# Patient Record
Sex: Male | Born: 1990 | Race: White | Hispanic: No | Marital: Married | Smoking: Former smoker
Health system: Southern US, Community
[De-identification: ages and names within clinical notes are randomized; demographics above are authoritative.]

## PROBLEM LIST (undated history)

## (undated) DIAGNOSIS — K219 Gastro-esophageal reflux disease without esophagitis: Secondary | ICD-10-CM

## (undated) HISTORY — PX: NO PAST SURGERIES: SHX2092

---
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
## 2013-05-01 ENCOUNTER — Ambulatory Visit: Payer: Self-pay | Admitting: Physician Assistant

## 2014-04-22 ENCOUNTER — Ambulatory Visit: Payer: Self-pay

## 2016-09-30 ENCOUNTER — Encounter (HOSPITAL_COMMUNITY): Payer: Self-pay | Admitting: Emergency Medicine

## 2016-09-30 ENCOUNTER — Emergency Department (HOSPITAL_COMMUNITY)
Admission: EM | Admit: 2016-09-30 | Discharge: 2016-10-01 | Disposition: A | Discharge status: Home / Self Care | Payer: No Typology Code available for payment source | Attending: Dermatology | Admitting: Dermatology

## 2016-09-30 ENCOUNTER — Emergency Department (HOSPITAL_COMMUNITY): Payer: No Typology Code available for payment source

## 2016-09-30 DIAGNOSIS — S81812A Laceration without foreign body, left lower leg, initial encounter: Secondary | ICD-10-CM | POA: Diagnosis present

## 2016-09-30 DIAGNOSIS — Y929 Unspecified place or not applicable: Secondary | ICD-10-CM | POA: Insufficient documentation

## 2016-09-30 DIAGNOSIS — S61419A Laceration without foreign body of unspecified hand, initial encounter: Secondary | ICD-10-CM

## 2016-09-30 DIAGNOSIS — Z23 Encounter for immunization: Secondary | ICD-10-CM | POA: Insufficient documentation

## 2016-09-30 DIAGNOSIS — Y999 Unspecified external cause status: Secondary | ICD-10-CM | POA: Insufficient documentation

## 2016-09-30 DIAGNOSIS — Y939 Activity, unspecified: Secondary | ICD-10-CM | POA: Insufficient documentation

## 2016-09-30 DIAGNOSIS — W3189XA Contact with other specified machinery, initial encounter: Secondary | ICD-10-CM | POA: Diagnosis not present

## 2016-09-30 DIAGNOSIS — S61412A Laceration without foreign body of left hand, initial encounter: Secondary | ICD-10-CM | POA: Diagnosis not present

## 2016-09-30 MED ORDER — LIDOCAINE HCL (PF) 1 % IJ SOLN
20.0000 mL | Freq: Once | INTRAMUSCULAR | Status: AC
  Administered 2016-09-30: 20 mL via INTRADERMAL
  Filled 2016-09-30: qty 20

## 2016-09-30 MED ORDER — TETANUS-DIPHTH-ACELL PERTUSSIS 5-2.5-18.5 LF-MCG/0.5 IM SUSP
0.5000 mL | Freq: Once | INTRAMUSCULAR | Status: AC
  Administered 2016-09-30: 0.5 mL via INTRAMUSCULAR
  Filled 2016-09-30: qty 0.5

## 2016-09-30 MED ORDER — DIPHENHYDRAMINE HCL 50 MG/ML IJ SOLN
25.0000 mg | Freq: Once | INTRAMUSCULAR | Status: AC
  Administered 2016-10-01: 25 mg via INTRAVENOUS
  Filled 2016-09-30: qty 1

## 2016-09-30 MED ORDER — FENTANYL CITRATE (PF) 100 MCG/2ML IJ SOLN: 50.0000 ug | INTRAMUSCULAR | Status: DC | PRN

## 2016-09-30 MED ORDER — LIDOCAINE HCL (PF) 1 % IJ SOLN
10.0000 mL | Freq: Once | INTRAMUSCULAR | Status: AC
  Administered 2016-09-30: 10 mL via INTRADERMAL
  Filled 2016-09-30: qty 10

## 2016-09-30 MED ORDER — CEFAZOLIN SODIUM-DEXTROSE 1-4 GM/50ML-% IV SOLN
1.0000 g | Freq: Once | INTRAVENOUS | Status: AC
  Administered 2016-10-01: 1 g via INTRAVENOUS
  Filled 2016-09-30: qty 50

## 2016-09-30 MED ORDER — ONDANSETRON HCL 4 MG/2ML IJ SOLN
4.0000 mg | Freq: Once | INTRAMUSCULAR | Status: AC
  Administered 2016-09-30: 4 mg via INTRAVENOUS
  Filled 2016-09-30: qty 2

## 2016-09-30 NOTE — ED Provider Notes (Signed)
[facility] Provider Note   CSN: [id number] Arrival date & time: 09/30/16  1830     History   Chief Complaint Chief Complaint  Patient presents with  . Extremity Laceration    HPI Shawn Calhoun is a 26 y.o. male.  Patient reports that he was using a power tool. Slipped. Sustained lacerations to his left thumb as well as his left lower leg. Denies falling to the ground. Did not hit his head. Did not lose consciousness.   The history is provided by the patient and the EMS personnel.  Illness  This is a new problem. The current episode started less than 1 hour ago. The problem occurs constantly. The problem has not changed since onset.Pertinent negatives include no chest pain, no abdominal pain, no headaches and no shortness of breath. He has tried nothing for the symptoms.    History reviewed. No pertinent past medical history.  There are no active problems to display for this patient.   History reviewed. No pertinent surgical history.     Home Medications    Prior to Admission medications   Medication Sig Start Date End Date Taking? Authorizing Provider  cephALEXin (KEFLEX) 500 MG capsule Take 1 capsule (500 mg total) by mouth 3 (three) times daily. 10/01/16 10/08/16  Lindalou Hose, MD    Family History No family history on file.  Social History Social History  Substance Use Topics  . Smoking status: Never Smoker  . Smokeless tobacco: Never Used  . Alcohol use Yes     Comment: socially     Allergies   Augmentin [amoxicillin-pot clavulanate]; Penicillins; and Sulfa antibiotics   Review of Systems Review of Systems  Respiratory: Negative for shortness of breath.   Cardiovascular: Negative for chest pain.  Gastrointestinal: Negative for abdominal pain, nausea and vomiting.  Musculoskeletal: Negative for back pain and neck pain.  Skin: Positive for wound.  Neurological: Negative for headaches.       No LOC  All other systems reviewed and are  negative.    Physical Exam Updated Vital Signs BP 119/82   Pulse 80   Temp 98.3 F (36.8 C) (Oral)   Resp (!) 21   Ht 5\' 7"  (1.702 m)   Wt 74.8 kg (165 lb)   SpO2 100%   BMI 25.84 kg/m   Physical Exam  Constitutional: He appears well-developed and well-nourished.  HENT:  Head: Normocephalic and atraumatic.  Eyes: Conjunctivae are normal.  Neck: Neck supple.  Cardiovascular: Normal rate and regular rhythm.   No murmur heard. Pulmonary/Chest: Effort normal and breath sounds normal. No respiratory distress.  Abdominal: Soft. There is no tenderness.  Musculoskeletal: He exhibits no edema.  Large complex laceration to the left thumb. Wound with macerated edges. Hemostatic. All tendons are isolated and functional. Sensation intact in the distal thumb. Good cap refill. Large laceration to the left anterior lower leg. Hemostatic. Neurovascularly intact. Full range of motion.  Neurological: He is alert.  Skin: Skin is warm and dry.  Psychiatric: He has a normal mood and affect.  Nursing note and vitals reviewed.    ED Treatments / Results  Labs (all labs ordered are listed, but only abnormal results are displayed) Labs Reviewed - No data to display  EKG  EKG Interpretation None       Radiology Dg Tibia/fibula Left  Result Date: 09/30/2016 CLINICAL DATA:  26 year old male with trauma and laceration of the left leg. EXAM: LEFT TIBIA AND FIBULA - 2 VIEW COMPARISON:  None.  FINDINGS: There is no acute fracture or dislocation. The bones are well mineralized. No arthritic changes. There is laceration of the soft tissues of the proximal anteromedial leg. Tiny radiodense foci along the inferior margin of the laceration likely represent gravel. No definite radiopaque foreign object. IMPRESSION: 1. No acute osseous pathology. 2. Laceration of the skin and soft tissues of the medial aspect of the proximal leg. No radiopaque foreign object. Electronically Signed   By: Elgie CollardArash  Radparvar  M.D.   On: 09/30/2016 19:58   Dg Hand Complete Left  Result Date: 09/30/2016 CLINICAL DATA:  Left hand laceration.  Initial encounter. EXAM: LEFT HAND - COMPLETE 3+ VIEW COMPARISON:  None. FINDINGS: There is evidence of soft tissue injury to the thumb at the level the proximal phalanx. No radiopaque foreign body, fracture, or dislocation is identified. Bone mineralization appears normal. IMPRESSION: Thumb soft tissue injury without acute osseous abnormality. Electronically Signed   By: Sebastian AcheAllen  Grady M.D.   On: 09/30/2016 19:57    Procedures .Marland Kitchen.Laceration Repair Date/Time: 10/01/2016 12:27 AM Performed by: Lindalou Hose'ROURKE, Giah Fickett Authorized by: Cathren LaineSTEINL, KEVIN   Consent:    Consent obtained:  Verbal   Consent given by:  Patient Anesthesia (see MAR for exact dosages):    Anesthesia method:  Local infiltration   Local anesthetic:  Lidocaine 1% w/o epi Laceration details:    Location:  Hand   Hand location: L thumb.   Length (cm):  10   Depth (mm):  3 Repair type:    Repair type:  Complex Pre-procedure details:    Preparation:  Patient was prepped and draped in usual sterile fashion Exploration:    Wound exploration: wound explored through full range of motion and entire depth of wound probed and visualized   Treatment:    Area cleansed with:  Soap and water   Amount of cleaning:  Extensive   Irrigation solution:  Sterile saline   Irrigation method:  Syringe   Debridement:  Minimal Skin repair:    Repair method:  Sutures   Suture size:  5-0   Suture material:  Prolene   Number of sutures:  12 Approximation:    Approximation:  Loose   Vermilion border: poorly aligned   Post-procedure details:    Dressing:  Antibiotic ointment, splint for protection and sterile dressing   Patient tolerance of procedure:  Tolerated well, no immediate complications .Marland Kitchen.Laceration Repair Date/Time: 10/01/2016 12:29 AM Performed by: Lindalou Hose'ROURKE, Celeste Tavenner Authorized by: Cathren LaineSTEINL, KEVIN   Consent:    Consent obtained:   Verbal   Consent given by:  Patient Anesthesia (see MAR for exact dosages):    Anesthesia method:  Local infiltration   Local anesthetic:  Lidocaine 1% w/o epi Laceration details:    Location:  Leg   Leg location:  L lower leg   Length (cm):  10   Depth (mm):  20 Repair type:    Repair type:  Intermediate Exploration:    Wound exploration: wound explored through full range of motion and entire depth of wound probed and visualized   Treatment:    Amount of cleaning:  Extensive   Irrigation solution:  Sterile saline   Irrigation method:  Syringe Subcutaneous repair:    Suture size:  3-0   Suture material:  Chromic gut   Suture technique:  Simple interrupted   Number of sutures:  4 Skin repair:    Repair method:  Sutures   Suture size:  3-0   Suture material:  Prolene   Suture technique:  Simple interrupted   Number of sutures:  16 Approximation:    Approximation:  Close   Vermilion border: well-aligned   Post-procedure details:    Dressing:  Antibiotic ointment and sterile dressing   Patient tolerance of procedure:  Tolerated well, no immediate complications   (including critical care time)  Medications Ordered in ED Medications  fentaNYL (SUBLIMAZE) injection 50 mcg (not administered)  ceFAZolin (ANCEF) IVPB 1 g/50 mL premix (1 g Intravenous New Bag/Given 10/01/16 0015)  Tdap (BOOSTRIX) injection 0.5 mL (0.5 mLs Intramuscular Given 09/30/16 1921)  lidocaine (PF) (XYLOCAINE) 1 % injection 10 mL (10 mLs Intradermal Given by Other 09/30/16 1922)  lidocaine (PF) (XYLOCAINE) 1 % injection 20 mL (20 mLs Intradermal Given by Other 09/30/16 2304)  ondansetron (ZOFRAN) injection 4 mg (4 mg Intravenous Given 09/30/16 2304)  diphenhydrAMINE (BENADRYL) injection 25 mg (25 mg Intravenous Given 10/01/16 0015)     Initial Impression / Assessment and Plan / ED Course  I have reviewed the triage vital signs and the nursing notes.  Pertinent labs & imaging results that were available  during my care of the patient were reviewed by me and considered in my medical decision making (see chart for details).     Patient had large complex lacerations on his left thumb as well as his left lower leg. X-rays did not show any evidence of bony involvement. Gave him a dose of antibiotics here. We'll start him on prophylactic antibiotics. Repair the wounds as above. Provided information to have him follow up with outpatient hand surgery. Wound care information provided. Place the patient in a splint for his left hand to minimize movement. Encouraged to minimize movement of the left leg for now. Told to follow up with his primary care doctor as well in 10-14 days for suture removal.  Final Clinical Impressions(s) / ED Diagnoses   Final diagnoses:  Laceration of left leg  Laceration of hand  Laceration of left hand, foreign body presence unspecified, initial encounter    New Prescriptions New Prescriptions   CEPHALEXIN (KEFLEX) 500 MG CAPSULE    Take 1 capsule (500 mg total) by mouth 3 (three) times daily.     Lindalou Hose, MD 10/01/16 Leanord Hawking    Cathren Laine, MD 10/03/16 [phone]

## 2016-09-30 NOTE — ED Triage Notes (Signed)
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

## 2016-10-01 MED ORDER — CEPHALEXIN 500 MG PO CAPS: 500.0000 mg | ORAL_CAPSULE | Freq: Three times a day (TID) | ORAL | 0 refills | Status: AC

## 2017-12-06 ENCOUNTER — Encounter: Payer: Self-pay | Admitting: Emergency Medicine

## 2017-12-06 ENCOUNTER — Ambulatory Visit
Admission: EM | Admit: 2017-12-06 | Discharge: 2017-12-06 | Disposition: A | Discharge status: Home / Self Care | Payer: PRIVATE HEALTH INSURANCE | Attending: Family Medicine | Admitting: Family Medicine

## 2017-12-06 ENCOUNTER — Other Ambulatory Visit: Payer: Self-pay

## 2017-12-06 DIAGNOSIS — Z882 Allergy status to sulfonamides status: Secondary | ICD-10-CM | POA: Insufficient documentation

## 2017-12-06 DIAGNOSIS — Z880 Allergy status to penicillin: Secondary | ICD-10-CM | POA: Diagnosis not present

## 2017-12-06 DIAGNOSIS — E161 Other hypoglycemia: Secondary | ICD-10-CM | POA: Diagnosis present

## 2017-12-06 DIAGNOSIS — R450 Nervousness: Secondary | ICD-10-CM

## 2017-12-06 DIAGNOSIS — E162 Hypoglycemia, unspecified: Secondary | ICD-10-CM | POA: Diagnosis not present

## 2017-12-06 LAB — BASIC METABOLIC PANEL
ANION GAP: 11 (ref 5–15)
BUN: 19 mg/dL (ref 6–20)
CHLORIDE: 100 mmol/L (ref 98–111)
CO2: 25 mmol/L (ref 22–32)
Calcium: 9.1 mg/dL (ref 8.9–10.3)
Creatinine, Ser: 0.95 mg/dL (ref 0.61–1.24)
GFR calc Af Amer: >60 mL/min (ref >60)
GLUCOSE: 121 mg/dL — AB (ref 70–99)
POTASSIUM: 3.6 mmol/L (ref 3.5–5.1)
Sodium: 136 mmol/L (ref 135–145)

## 2017-12-06 NOTE — ED Provider Notes (Signed)
[facility]    CSN: [id number] Arrival date & time: 12/06/17  1039     History   Chief Complaint Chief Complaint  Patient presents with  . Hypoglycemia    HPI Shawn Calhoun is a 27 y.o. male.   27 yo male with a c/o low blood sugar of 52 this morning around 9:30am and associated with jitteriness. States he's not diabetic. States he's had a cold recently and last night as well as this morning he took some Alka Seltzer Cold medicine and drank a lot of caffeine. About 2 hours later is when he felt the onset of symptoms. He ate some food at that time and since then blood sugar has been normalizing.   The history is provided by the patient.  Hypoglycemia    History reviewed. No pertinent past medical history.  There are no active problems to display for this patient.   History reviewed. No pertinent surgical history.     Home Medications    Prior to Admission medications   Medication Sig Start Date End Date Taking? Authorizing Provider  Phenyleph-CPM-DM-APAP (ALKA-SELTZER PLUS COLD & COUGH) 09-05-08-325 MG CAPS Take by mouth.   Yes [provider]    Family History Family History  Problem Relation Age of Onset  . Healthy Mother   . Alzheimer's disease Father   . Hyperlipidemia Father     Social History Social History   Tobacco Use  . Smoking status: Never Smoker  . Smokeless tobacco: Never Used  Substance Use Topics  . Alcohol use: Yes    Comment: socially  . Drug use: No     Allergies   Augmentin [amoxicillin-pot clavulanate]; Penicillins; and Sulfa antibiotics   Review of Systems Review of Systems   Physical Exam Triage Vital Signs ED Triage Vitals  Enc Vitals Group     BP 12/06/17 1051 (!) 145/104     Pulse Rate 12/06/17 1051 (!) 110     Resp --      Temp 12/06/17 1051 98.6 F (37 C)     Temp Source 12/06/17 1051 Oral     SpO2 12/06/17 1051 99 %     Weight 12/06/17 1052 165 lb (74.8 kg)     Height 12/06/17 1052 5'  7" (1.702 m)     Head Circumference --      Peak Flow --      Pain Score 12/06/17 1206 0     Pain Loc --      Pain Edu? --      Excl. in GC? --    No data found.  Updated Vital Signs BP 118/80 (BP Location: Left Arm)   Pulse (!) 110   Temp 98.6 F (37 C) (Oral)   Ht 5\' 7"  (1.702 m)   Wt 165 lb (74.8 kg)   SpO2 99%   BMI 25.84 kg/m   Visual Acuity Right Eye Distance:   Left Eye Distance:   Bilateral Distance:    Right Eye Near:   Left Eye Near:    Bilateral Near:     Physical Exam  Constitutional: He appears well-developed and well-nourished. No distress.  HENT:  Head: Normocephalic and atraumatic.  Right Ear: Tympanic membrane, external ear and ear canal normal.  Left Ear: Tympanic membrane, external ear and ear canal normal.  Nose: Nose normal.  Mouth/Throat: Uvula is midline, oropharynx is clear and moist and mucous membranes are normal. No oropharyngeal exudate or tonsillar abscesses.  Eyes: Pupils are equal, round,  and reactive to light. Conjunctivae and EOM are normal. Right eye exhibits no discharge. Left eye exhibits no discharge. No scleral icterus.  Neck: Normal range of motion. Neck supple. No tracheal deviation present. No thyromegaly present.  Cardiovascular: Normal rate, regular rhythm and normal heart sounds.  Pulmonary/Chest: Effort normal and breath sounds normal. No stridor. No respiratory distress. He has no wheezes. He has no rales. He exhibits no tenderness.  Lymphadenopathy:    He has no cervical adenopathy.  Neurological: He is alert.  Skin: Skin is warm and dry. No rash noted. He is not diaphoretic.  Nursing note and vitals reviewed.    UC Treatments / Results  Labs (all labs ordered are listed, but only abnormal results are displayed) Labs Reviewed  BASIC METABOLIC PANEL - Abnormal; Notable for the following components:      Result Value   Glucose, Bld 121 (*)    All other components within normal limits    EKG None  Radiology No  results found.  Procedures ED EKG Date/Time: 12/06/2017 1:11 PM Performed by: Payton Mccallum, MD Authorized by: Payton Mccallum, MD   ECG reviewed by ED Physician in the absence of a cardiologist: yes   Previous ECG:    Previous ECG:  Unavailable Interpretation:    Interpretation: normal   Rate:    ECG rate:  96   ECG rate assessment: normal   Rhythm:    Rhythm: sinus rhythm   Ectopy:    Ectopy: none   QRS:    QRS axis:  Normal Conduction:    Conduction: normal   ST segments:    ST segments:  Normal T waves:    T waves: normal     (including critical care time)  Medications Ordered in UC Medications - No data to display  Initial Impression / Assessment and Plan / UC Course  I have reviewed the triage vital signs and the nursing notes.  Pertinent labs & imaging results that were available during my care of the patient were reviewed by me and considered in my medical decision making (see chart for details).      Final Clinical Impressions(s) / UC Diagnoses   Final diagnoses:  Reactive hypoglycemia   Discharge Instructions   None    ED Prescriptions    None     1. Labs/ekg results and diagnosis reviewed with patient 2. Recommend supportive treatment with frequent small snacks/meals, cut back on caffeinated beverages, d/c otc cold medicine 3. Follow-up prn if symptoms worsen or don't improve   Controlled Substance Prescriptions [place] Controlled Substance Registry consulted? Not Applicable   Payton Mccallum, MD 12/06/17 [phone]

## 2017-12-06 NOTE — ED Triage Notes (Signed)
Pt reports onset of symptoms around 0930, felt jittery, ate food, checked fsbs, states was 52, ate again, fsbs 82, then 114

## 2018-02-13 ENCOUNTER — Ambulatory Visit: Payer: PRIVATE HEALTH INSURANCE | Admitting: Family Medicine

## 2018-02-13 ENCOUNTER — Encounter: Payer: Self-pay | Admitting: Family Medicine

## 2018-02-13 VITALS — BP 138/82 | HR 72 | Ht 67.0 in | Wt 160.0 lb

## 2018-02-13 DIAGNOSIS — Z7689 Persons encountering health services in other specified circumstances: Secondary | ICD-10-CM

## 2018-02-13 NOTE — Progress Notes (Signed)
    Date:  02/13/2018   Name:  Shawn Calhoun   DOB:  [birth year]   MRN:  [id number]   Chief Complaint: Establish Care Patient is a 27 year old male who presents for a establishment of care. exam. The patient reports the following problems: hypoglycemia. Health maintenance has been reviewed influenza/not at this time.    Review of Systems  Constitutional: Negative for chills and fever.  HENT: Negative for drooling, ear discharge, ear pain and sore throat.   Respiratory: Negative for cough, shortness of breath and wheezing.   Cardiovascular: Negative for chest pain, palpitations and leg swelling.  Gastrointestinal: Negative for abdominal pain, blood in stool, constipation, diarrhea and nausea.  Endocrine: Negative for polydipsia.  Genitourinary: Negative for dysuria, frequency, hematuria and urgency.  Musculoskeletal: Negative for back pain, myalgias and neck pain.  Skin: Negative for rash.  Allergic/Immunologic: Negative for environmental allergies.  Neurological: Negative for dizziness and headaches.  Hematological: Does not bruise/bleed easily.  Psychiatric/Behavioral: Negative for suicidal ideas. The patient is not nervous/anxious.     There are no active problems to display for this patient.   Allergies  Allergen Reactions  . Augmentin [Amoxicillin-Pot Clavulanate] Other (See Comments)    unknown  . Penicillins Hives  . Sulfa Antibiotics Hives    History reviewed. No pertinent surgical history.  Social History   Tobacco Use  . Smoking status: Never Smoker  . Smokeless tobacco: Never Used  Substance Use Topics  . Alcohol use: Yes    Comment: socially  . Drug use: No     Medication list has been reviewed and updated.  No outpatient medications have been marked as taking for the 02/13/18 encounter (Office Visit) with Duanne Limerick, MD.    [facility] 2/9 Scores 02/13/2018  PHQ - 2 Score 0  PHQ- 9 Score 0    Physical Exam  Constitutional: He is oriented to  person, place, and time.  HENT:  Head: Normocephalic.  Right Ear: External ear normal.  Left Ear: External ear normal.  Nose: Nose normal.  Mouth/Throat: Oropharynx is clear and moist.  Eyes: Pupils are equal, round, and reactive to light. Conjunctivae and EOM are normal. Right eye exhibits no discharge. Left eye exhibits no discharge. No scleral icterus.  Neck: Normal range of motion. Neck supple. No JVD present. No tracheal deviation present. No thyromegaly present.  Cardiovascular: Normal rate, regular rhythm, normal heart sounds and intact distal pulses. Exam reveals no gallop and no friction rub.  No murmur heard. Pulmonary/Chest: Breath sounds normal. No respiratory distress. He has no wheezes. He has no rales.  Abdominal: Soft. Bowel sounds are normal. He exhibits no mass. There is no hepatosplenomegaly. There is no tenderness. There is no rebound, no guarding and no CVA tenderness.  Musculoskeletal: Normal range of motion. He exhibits no edema or tenderness.  Lymphadenopathy:    He has no cervical adenopathy.  Neurological: He is alert and oriented to person, place, and time. He has normal strength and normal reflexes. No cranial nerve deficit.  Skin: Skin is warm. No rash noted.  Nursing note and vitals reviewed.   BP 138/82   Pulse 72   Ht 5\' 7"  (1.702 m)   Wt 160 lb (72.6 kg)   BMI 25.06 kg/m   Assessment and Plan:  1. Establishing care with new doctor, encounter for Established care today. Reviewed recent evaluations for hypoglycemic episodes.   Dr. Hayden [facility]  02/13/2018

## 2018-06-02 ENCOUNTER — Ambulatory Visit: Payer: PRIVATE HEALTH INSURANCE | Admitting: Family Medicine

## 2018-06-02 ENCOUNTER — Encounter: Payer: Self-pay | Admitting: Family Medicine

## 2018-06-02 VITALS — BP 110/62 | HR 88 | Temp 98.3°F | Ht 67.0 in | Wt 161.0 lb

## 2018-06-02 DIAGNOSIS — J0100 Acute maxillary sinusitis, unspecified: Secondary | ICD-10-CM

## 2018-06-02 DIAGNOSIS — R509 Fever, unspecified: Secondary | ICD-10-CM

## 2018-06-02 DIAGNOSIS — L0501 Pilonidal cyst with abscess: Secondary | ICD-10-CM | POA: Diagnosis not present

## 2018-06-02 LAB — POCT INFLUENZA A/B
INFLUENZA A, POC: NEGATIVE
INFLUENZA B, POC: NEGATIVE

## 2018-06-02 MED ORDER — DOXYCYCLINE HYCLATE 100 MG PO TABS: 100.0000 mg | ORAL_TABLET | Freq: Two times a day (BID) | ORAL | 0 refills | Status: DC

## 2018-06-02 NOTE — Progress Notes (Signed)
Date:  06/02/2018   Name:  Shawn Calhoun   DOB:  [birth year]   MRN:  [id number]   Chief Complaint: Sinusitis (started about 1 week ago with cong. Sat had a fever- called [facility]edoc, told to take albuterol, zyrtec and flonase. Still has cough and fever. )  Sinusitis  This is a new problem. The current episode started in the past 7 days. The problem has been gradually worsening since onset. There has been no fever. The fever has been present for 1 to 2 days. His pain is at a severity of 4/10. The pain is moderate. Associated symptoms include congestion, coughing, diaphoresis, ear pain, shortness of breath, sinus pressure and a sore throat. Pertinent negatives include no chills, headaches, hoarse voice, neck pain, sneezing or swollen glands. Past treatments include acetaminophen. The treatment provided mild relief.    Review of Systems  Constitutional: Positive for diaphoresis. Negative for chills and fever.  HENT: Positive for congestion, ear pain, sinus pressure and sore throat. Negative for drooling, ear discharge, hoarse voice and sneezing.   Respiratory: Positive for cough and shortness of breath. Negative for wheezing.   Cardiovascular: Negative for chest pain, palpitations and leg swelling.  Gastrointestinal: Negative for abdominal pain, blood in stool, constipation, diarrhea and nausea.  Endocrine: Negative for polydipsia.  Genitourinary: Negative for dysuria, frequency, hematuria and urgency.  Musculoskeletal: Negative for back pain, myalgias and neck pain.  Skin: Negative for rash.  Allergic/Immunologic: Negative for environmental allergies.  Neurological: Negative for dizziness and headaches.  Hematological: Does not bruise/bleed easily.  Psychiatric/Behavioral: Negative for suicidal ideas. The patient is not nervous/anxious.     There are no active problems to display for this patient.   Allergies  Allergen Reactions  . Augmentin [Amoxicillin-Pot Clavulanate] Other (See  Comments)    unknown  . Penicillins Hives  . Sulfa Antibiotics Hives    No past surgical history on file.  Social History   Tobacco Use  . Smoking status: Never Smoker  . Smokeless tobacco: Never Used  Substance Use Topics  . Alcohol use: Yes    Comment: socially  . Drug use: No     Medication list has been reviewed and updated.  Current Meds  Medication Sig  . albuterol (PROVENTIL HFA;VENTOLIN HFA) 108 (90 Base) MCG/ACT inhaler Inhale 1 puff into the lungs every 6 (six) hours as needed for wheezing or shortness of breath.  . cetirizine (ZYRTEC) 10 MG tablet Take 10 mg by mouth daily.  . fluticasone (FLONASE) 50 MCG/ACT nasal spray Place 1 spray into both nostrils 2 (two) times daily.  Marland Kitchen ibuprofen (ADVIL,MOTRIN) 200 MG tablet Take 400 mg by mouth every 6 (six) hours as needed.    PHQ 2/9 Scores 02/13/2018  PHQ - 2 Score 0  PHQ- 9 Score 0    Physical Exam Vitals signs and nursing note reviewed.  HENT:     Head: Normocephalic.     Jaw: There is normal jaw occlusion.     Right Ear: Tympanic membrane, ear canal and external ear normal.     Left Ear: Tympanic membrane, ear canal and external ear normal.     Nose:     Right Turbinates: Enlarged.     Left Turbinates: Enlarged.     Right Sinus: Maxillary sinus tenderness present.     Left Sinus: Maxillary sinus tenderness present.     Mouth/Throat:     Pharynx: Posterior oropharyngeal erythema present. No oropharyngeal exudate.  Eyes:     General:  No scleral icterus.       Right eye: No discharge.        Left eye: No discharge.     Conjunctiva/sclera: Conjunctivae normal.     Pupils: Pupils are equal, round, and reactive to light.  Neck:     Musculoskeletal: Full passive range of motion without pain, normal range of motion and neck supple.     Thyroid: No thyroid mass, thyromegaly or thyroid tenderness.     Vascular: No JVD.     Trachea: No tracheal deviation.     Meningeal: Brudzinski's sign and Kernig's sign  absent.  Cardiovascular:     Rate and Rhythm: Normal rate and regular rhythm.     Heart sounds: Normal heart sounds. No murmur. No friction rub. No gallop.   Pulmonary:     Effort: No respiratory distress.     Breath sounds: Normal breath sounds. No decreased breath sounds, wheezing, rhonchi or rales.  Abdominal:     General: Bowel sounds are normal.     Palpations: Abdomen is soft. There is no hepatomegaly, splenomegaly or mass.     Tenderness: There is no abdominal tenderness. There is no guarding or rebound.  Musculoskeletal: Normal range of motion.        General: No tenderness.  Lymphadenopathy:     Head:     Right side of head: Submandibular adenopathy present.     Left side of head: Submandibular adenopathy present.     Cervical: Cervical adenopathy present.  Skin:    General: Skin is warm.     Findings: Erythema present. No rash.     Comments: Pilonidal drainage with tenderness  Neurological:     Mental Status: He is alert and oriented to person, place, and time.     Cranial Nerves: No cranial nerve deficit.     Deep Tendon Reflexes: Reflexes are normal and symmetric.     BP 110/62   Pulse 88   Temp 98.3 F (36.8 C) (Oral)   Ht 5\' 7"  (1.702 m)   Wt 161 lb (73 kg)   SpO2 99%   BMI 25.22 kg/m   Assessment and Plan: 1. Fever and chills Acute.  Fever and chills.  Will check for influenza AB.  POCT AB was negative will proceed with treatment of sinusitis - POCT Influenza A/B  2. Acute non-recurrent maxillary sinusitis Acute.  Nonrecurrent.  Will initiate doxycycline 100 mg twice a day for 10 days - doxycycline (VIBRA-TABS) 100 MG tablet; Take 1 tablet (100 mg total) by mouth 2 (two) times daily.  Dispense: 20 tablet; Refill: 0  3. Pilonidal abscess Patient had a previous pilonidal cyst treated the latter part of last year.  Will initiate doxycycline 100 mg twice a day and referral to general surgery for likely unroofing and drainage. - doxycycline (VIBRA-TABS) 100  MG tablet; Take 1 tablet (100 mg total) by mouth 2 (two) times daily.  Dispense: 20 tablet; Refill: 0 - Ambulatory referral to General Surgery

## 2018-06-02 NOTE — Patient Instructions (Signed)
Pilonidal Cyst    A pilonidal cyst is a fluid-filled sac that forms beneath the skin near the tailbone, at the top of the crease of the buttocks (pilonidal area). If the cyst is not large and not infected, it may not cause any problems.  If the cyst becomes irritated or infected, it may get larger and fill with pus. An infected cyst is called an abscess. A pilonidal abscess may cause pain and swelling, and it may need to be drained or removed.  What are the causes?  The cause of this condition is not always known. In some cases, a hair that grows into your skin (ingrown hair) may be the cause.  What increases the risk?  You are more likely to get a pilonidal cyst if you:   Are male.   Have lots of hair near the crease of the buttocks.   Are overweight.   Have a dimple near the crease of the buttocks.   Wear tight clothing.   Do not bathe or shower often.   Sit for long periods of time.  What are the signs or symptoms?  Signs and symptoms of a pilonidal cyst may include pain, swelling, redness, and warmth in the pilonidal area. Depending on how big the cyst is, you may be able to feel a lump near your tailbone. If your cyst becomes infected, symptoms may include:   Pus or fluid drainage.   Fever.   Pain, swelling, and redness getting worse.   The lump getting bigger.  How is this diagnosed?  This condition may be diagnosed based on:   Your symptoms and medical history.   A physical exam.   A blood test to check for infection.   Testing a pus sample, if applicable.  How is this treated?  If your cyst does not cause symptoms, you may not need any treatment. If your cyst bothers you or is infected, you may need a procedure to drain or remove the cyst. Depending on the size, location, and severity of your cyst, your health care provider may:   Make an incision in the cyst and drain it (incision and drainage).   Open and drain the cyst, and then stitch the wound so that it stays open while it heals  (marsupialization). You will be given instructions about how to care for your open wound while it heals.   Remove all or part of the cyst, and then close the wound (cyst removal).  You may need to take antibiotic medicines before your procedure.  Follow these instructions at home:  Medicines   Take over-the-counter and prescription medicines only as told by your health care provider.   If you were prescribed an antibiotic medicine, take it as told by your health care provider. Do not stop taking the antibiotic even if you start to feel better.  General instructions   Keep the area around your pilonidal cyst clean and dry.   If there is fluid or pus draining from your cyst:  ? Cover the area with a clean bandage (dressing) as needed.  ? Wash the area gently with soap and water. Pat the area dry with a clean towel. Do not rub the area because that may cause bleeding.   Remove hair from the area around the cyst only if your health care provider tells you to do this.   Do not wear tight pants or sit in one position for long periods at a time.   Keep all follow-up   visits as told by your health care provider. This is important.  Contact a health care provider if you have:   New redness, swelling, or pain.   A fever.   Severe pain.  Summary   A pilonidal cyst is a fluid-filled sac that forms beneath the skin near the tailbone, at the top of the crease of the buttocks (pilonidal area).   If the cyst becomes irritated or infected, it may get larger and fill with pus. An infected cyst is called an abscess.   The cause of this condition is not always known. In some cases, a hair that grows into your skin (ingrown hair) may be the cause.   If your cyst does not cause symptoms, you may not need any treatment. If your cyst bothers you or is infected, you may need a procedure to drain or remove the cyst.  This information is not intended to replace advice given to you by your health care provider. Make sure you  discuss any questions you have with your health care provider.  Document Released: 04/20/2000 Document Revised: 04/11/2017 Document Reviewed: 04/11/2017  Elsevier Interactive Patient Education  2019 Elsevier Inc.

## 2018-06-10 ENCOUNTER — Ambulatory Visit: Payer: PRIVATE HEALTH INSURANCE | Admitting: General Surgery

## 2018-06-10 ENCOUNTER — Other Ambulatory Visit: Payer: Self-pay

## 2018-06-10 VITALS — BP 113/79 | HR 97 | Temp 98.1°F | Ht 61.0 in | Wt 163.0 lb

## 2018-06-10 DIAGNOSIS — L0591 Pilonidal cyst without abscess: Secondary | ICD-10-CM

## 2018-06-10 NOTE — Progress Notes (Signed)
Patient ID: Shawn Calhoun, male   DOB: [birth year], 29 y.o.   MRN: [id number]  Chief Complaint  Patient presents with  . Other    pilonidal     HPI Shawn Calhoun is a 28 y.o. male here today for a evaluation of a pilonidal cyst. He states he has had several episodes over the last 2-3 years where he would develop discomfort and occasional drainage.  Last significant episode occurred about six months ago.  Recent episode began last week but is now subsiding.  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  No past medical history on file.  No past surgical history on file.  Family History  Problem Relation Age of Onset  . Healthy Mother   . Alzheimer's disease Father   . Hyperlipidemia Father     Social History Social History   Tobacco Use  . Smoking status: Never Smoker  . Smokeless tobacco: Never Used  Substance Use Topics  . Alcohol use: Yes    Comment: socially  . Drug use: No    Allergies  Allergen Reactions  . Augmentin [Amoxicillin-Pot Clavulanate] Other (See Comments)    unknown  . Penicillins Hives    Did it involve swelling of the face/tongue/throat, SOB, or low BP? No Did it involve sudden or severe rash/hives, skin peeling, or any reaction on the inside of your mouth or nose? No Did you need to seek medical attention at a hospital or doctor's office? No When did it last happen?Childhood allergy If all above answers are "NO", may proceed with cephalosporin use.   . Sulfa Antibiotics Hives    Current Outpatient Medications  Medication Sig Dispense Refill  . albuterol (PROVENTIL HFA;VENTOLIN HFA) 108 (90 Base) MCG/ACT inhaler Inhale 1 puff into the lungs every 6 (six) hours as needed for wheezing or shortness of breath.    . doxycycline (VIBRA-TABS) 100 MG tablet Take 1 tablet (100 mg total) by mouth 2 (two) times daily. 20 tablet 0  . ibuprofen (ADVIL,MOTRIN) 200 MG tablet Take 400 mg by mouth every 6 (six) hours as needed for moderate  pain.     Marland Kitchen acetaminophen (TYLENOL) 500 MG tablet Take 1,000 mg by mouth every 6 (six) hours as needed for moderate pain.     No current facility-administered medications for this visit.     Review of Systems Review of Systems  Constitutional: Negative.   Respiratory: Negative.   Cardiovascular: Negative.     Blood pressure 113/79, pulse 97, temperature 98.1 F (36.7 C), temperature source Skin, height 5\' 1"  (1.549 m), weight 163 lb (73.9 kg), SpO2 98 %.  Physical Exam Physical Exam Eyes:     General: No scleral icterus.    Conjunctiva/sclera: Conjunctivae normal.  Neck:     Musculoskeletal: Normal range of motion and neck supple.  Cardiovascular:     Rate and Rhythm: Normal rate.  Pulmonary:     Effort: Pulmonary effort is normal.     Breath sounds: Normal breath sounds and air entry.  Musculoskeletal:       Back:  Skin:    General: Skin is warm and dry.  Neurological:     Mental Status: He is alert and oriented to person, place, and time.       Data Reviewed Laboratory studies of December 06, 2017 showed an elevated blood sugar, nonfasting, of 121.  Normal electrolytes.  Assessment    Pilonidal cyst without abscess, symptomatic.  Single episode of hyperglycemia.  Plan  Patient to be scheduled fro pilonidal cyst excision . The patient is aware to call back for any questions or concerns.   HPI, Physical Exam, Assessment and Plan have been scribed under the direction and in the presence of Donnalee Curry, MD.  Ples Specter, CMA  [facility] 06/11/2018, 1:30 PM

## 2018-06-10 NOTE — Patient Instructions (Addendum)
The patient is scheduled for surgery at [facility] with Dr Lemar Livings on 06/16/18. He will pre admit by phone and pre admit testing will call him to do this. The patient is aware of date and instructions.   Pilonidal Cyst  A pilonidal cyst is a fluid-filled sac that forms beneath the skin near the tailbone, at the top of the crease of the buttocks (pilonidal area). If the cyst is not large and not infected, it may not cause any problems. If the cyst becomes irritated or infected, it may get larger and fill with pus. An infected cyst is called an abscess. A pilonidal abscess may cause pain and swelling, and it may need to be drained or removed. What are the causes? The cause of this condition is not always known. In some cases, a hair that grows into your skin (ingrown hair) may be the cause. What increases the risk? You are more likely to get a pilonidal cyst if you:  Are male.  Have lots of hair near the crease of the buttocks.  Are overweight.  Have a dimple near the crease of the buttocks.  Wear tight clothing.  Do not bathe or shower often.  Sit for long periods of time. What are the signs or symptoms? Signs and symptoms of a pilonidal cyst may include pain, swelling, redness, and warmth in the pilonidal area. Depending on how big the cyst is, you may be able to feel a lump near your tailbone. If your cyst becomes infected, symptoms may include:  Pus or fluid drainage.  Fever.  Pain, swelling, and redness getting worse.  The lump getting bigger. How is this diagnosed? This condition may be diagnosed based on:  Your symptoms and medical history.  A physical exam.  A blood test to check for infection.  Testing a pus sample, if applicable. How is this treated? If your cyst does not cause symptoms, you may not need any treatment. If your cyst bothers you or is infected, you may need a procedure to drain or remove the cyst. Depending on the size, location, and severity of your cyst,  your health care provider may:  Make an incision in the cyst and drain it (incision and drainage).  Open and drain the cyst, and then stitch the wound so that it stays open while it heals (marsupialization). You will be given instructions about how to care for your open wound while it heals.  Remove all or part of the cyst, and then close the wound (cyst removal). You may need to take antibiotic medicines before your procedure. Follow these instructions at home: Medicines  Take over-the-counter and prescription medicines only as told by your health care provider.  If you were prescribed an antibiotic medicine, take it as told by your health care provider. Do not stop taking the antibiotic even if you start to feel better. General instructions  Keep the area around your pilonidal cyst clean and dry.  If there is fluid or pus draining from your cyst: ? Cover the area with a clean bandage (dressing) as needed. ? Wash the area gently with soap and water. Pat the area dry with a clean towel. Do not rub the area because that may cause bleeding.  Remove hair from the area around the cyst only if your health care provider tells you to do this.  Do not wear tight pants or sit in one position for long periods at a time.  Keep all follow-up visits as told by your  health care provider. This is important. Contact a health care provider if you have:  New redness, swelling, or pain.  A fever.  Severe pain. Summary  A pilonidal cyst is a fluid-filled sac that forms beneath the skin near the tailbone, at the top of the crease of the buttocks (pilonidal area).  If the cyst becomes irritated or infected, it may get larger and fill with pus. An infected cyst is called an abscess.  The cause of this condition is not always known. In some cases, a hair that grows into your skin (ingrown hair) may be the cause.  If your cyst does not cause symptoms, you may not need any treatment. If your cyst  bothers you or is infected, you may need a procedure to drain or remove the cyst. This information is not intended to replace advice given to you by your health care provider. Make sure you discuss any questions you have with your health care provider. Document Released: 04/20/2000 Document Revised: 04/11/2017 Document Reviewed: 04/11/2017 Elsevier Interactive Patient Education  2019 [employer]

## 2018-06-11 DIAGNOSIS — L0591 Pilonidal cyst without abscess: Secondary | ICD-10-CM | POA: Insufficient documentation

## 2018-06-12 ENCOUNTER — Other Ambulatory Visit: Payer: Self-pay

## 2018-06-12 ENCOUNTER — Encounter
Admission: RE | Admit: 2018-06-12 | Discharge: 2018-06-12 | Disposition: A | Discharge status: Home / Self Care | Payer: PRIVATE HEALTH INSURANCE | Source: Ambulatory Visit | Attending: General Surgery | Admitting: General Surgery

## 2018-06-12 HISTORY — DX: Gastro-esophageal reflux disease without esophagitis: K21.9

## 2018-06-12 NOTE — Patient Instructions (Signed)
Your procedure is scheduled on: 06-16-18 Report to Same Day Surgery 2nd floor medical mall [facility] Entrance-take elevator on left to 2nd floor.  Check in with surgery information desk.) To find out your arrival time please call [phone] between 1PM - 3PM on 06-13-18  Remember: Instructions that are not followed completely may result in serious medical risk, up to and including death, or upon the discretion of your surgeon and anesthesiologist your surgery may need to be rescheduled.    _x___ 1. Do not eat food after midnight the night before your procedure. You may drink clear liquids up to 2 hours before you are scheduled to arrive at the hospital for your procedure.  Do not drink clear liquids within 2 hours of your scheduled arrival to the hospital.  Clear liquids include  --Water or Apple juice without pulp  --Clear carbohydrate beverage such as ClearFast or Gatorade  --Black Coffee or Clear Tea (No milk, no creamers, do not add anything to the coffee or Tea   ____Ensure clear carbohydrate drink on the way to the hospital for bariatric patients  ____Ensure clear carbohydrate drink 3 hours before surgery for Dr Rutherford Nail patients if physician instructed.   No gum chewing or hard candies.     __x__ 2. No Alcohol for 24 hours before or after surgery.   __x__3. No Smoking or e-cigarettes for 24 prior to surgery.  Do not use any chewable tobacco products for at least 6 hour prior to surgery   ____  4. Bring all medications with you on the day of surgery if instructed.    __x__ 5. Notify your doctor if there is any change in your medical condition     (cold, fever, infections).    x___6. On the morning of surgery brush your teeth with toothpaste and water.  You may rinse your mouth with mouth wash if you wish.  Do not swallow any toothpaste or mouthwash.   Do not wear jewelry, make-up, hairpins, clips or nail polish.  Do not wear lotions, powders, or perfumes. You may wear  deodorant.  Do not shave 48 hours prior to surgery. Men may shave face and neck.  Do not bring valuables to the hospital.    [facility] is not responsible for any belongings or valuables.               Contacts, dentures or bridgework may not be worn into surgery.  Leave your suitcase in the car. After surgery it may be brought to your room.  For patients admitted to the hospital, discharge time is determined by your treatment team.  _  Patients discharged the day of surgery will not be allowed to drive home.  You will need someone to drive you home and stay with you the night of your procedure.    Please read over the following fact sheets that you were given:   [facility] Preparing for Surgery   ____ Take anti-hypertensive listed below, cardiac, seizure, asthma, anti-reflux and psychiatric medicines. These include:  1. NONE  2.  3.  4.  5.  6.  ____Fleets enema or Magnesium Citrate as directed.   ____ Use CHG Soap or sage wipes as directed on instruction sheet   ____ Use inhalers on the day of surgery and bring to hospital day of surgery  ____ Stop Metformin and Janumet 2 days prior to surgery.    ____ Take 1/2 of usual insulin dose the night before surgery and none on  the morning     surgery.   ____ Follow recommendations from Cardiologist, Pulmonologist or PCP regarding stopping Aspirin, Coumadin, Plavix ,Eliquis, Effient, or Pradaxa, and Pletal.  ____Stop Anti-inflammatories such as Advil, Aleve, Ibuprofen, Motrin, Naproxen, Naprosyn, Goodies powders or aspirin products NOW-OK to take Tylenol    ____ Stop supplements until after surgery.     ____ Bring C-Pap to the hospital.

## 2018-06-14 ENCOUNTER — Encounter: Payer: Self-pay | Admitting: Anesthesiology

## 2018-06-15 MED ORDER — ERTAPENEM SODIUM 1 G IJ SOLR
1.0000 g | INTRAMUSCULAR | Status: AC
  Administered 2018-06-16: 1 g via INTRAVENOUS
  Filled 2018-06-15: qty 1

## 2018-06-16 ENCOUNTER — Ambulatory Visit: Payer: No Typology Code available for payment source | Admitting: Anesthesiology

## 2018-06-16 ENCOUNTER — Ambulatory Visit
Admission: RE | Admit: 2018-06-16 | Discharge: 2018-06-16 | Disposition: A | Discharge status: Home / Self Care | Payer: No Typology Code available for payment source | Attending: General Surgery | Admitting: General Surgery

## 2018-06-16 ENCOUNTER — Other Ambulatory Visit: Payer: Self-pay

## 2018-06-16 ENCOUNTER — Encounter: Payer: Self-pay | Admitting: *Deleted

## 2018-06-16 ENCOUNTER — Encounter
Admission: RE | Disposition: A | Discharge status: Home / Self Care | Payer: Self-pay | Source: Home / Self Care | Attending: General Surgery

## 2018-06-16 DIAGNOSIS — Z79899 Other long term (current) drug therapy: Secondary | ICD-10-CM | POA: Insufficient documentation

## 2018-06-16 DIAGNOSIS — Z880 Allergy status to penicillin: Secondary | ICD-10-CM | POA: Insufficient documentation

## 2018-06-16 DIAGNOSIS — L0501 Pilonidal cyst with abscess: Secondary | ICD-10-CM | POA: Diagnosis not present

## 2018-06-16 DIAGNOSIS — L0591 Pilonidal cyst without abscess: Secondary | ICD-10-CM | POA: Insufficient documentation

## 2018-06-16 DIAGNOSIS — Z87891 Personal history of nicotine dependence: Secondary | ICD-10-CM | POA: Diagnosis not present

## 2018-06-16 DIAGNOSIS — Z882 Allergy status to sulfonamides status: Secondary | ICD-10-CM | POA: Insufficient documentation

## 2018-06-16 HISTORY — PX: PILONIDAL CYST EXCISION: SHX744

## 2018-06-16 LAB — GLUCOSE, CAPILLARY: Glucose-Capillary: 86 mg/dL (ref 70–99)

## 2018-06-16 SURGERY — EXCISION, PILONIDAL CYST, EXTENSIVE
Anesthesia: General

## 2018-06-16 MED ORDER — DEXAMETHASONE SODIUM PHOSPHATE 10 MG/ML IJ SOLN
INTRAMUSCULAR | Status: DC | PRN
  Administered 2018-06-16: 10 mg via INTRAVENOUS

## 2018-06-16 MED ORDER — SUGAMMADEX SODIUM 200 MG/2ML IV SOLN
INTRAVENOUS | Status: AC
  Filled 2018-06-16: qty 2

## 2018-06-16 MED ORDER — LACTATED RINGERS IV SOLN
INTRAVENOUS | Status: DC
  Administered 2018-06-16: 08:00:00 via INTRAVENOUS

## 2018-06-16 MED ORDER — DEXMEDETOMIDINE HCL IN NACL 200 MCG/50ML IV SOLN
INTRAVENOUS | Status: AC
  Filled 2018-06-16: qty 50

## 2018-06-16 MED ORDER — GABAPENTIN 300 MG PO CAPS
ORAL_CAPSULE | ORAL | Status: AC
  Filled 2018-06-16: qty 1

## 2018-06-16 MED ORDER — SODIUM BICARBONATE 4 % IV SOLN
INTRAVENOUS | Status: DC | PRN
  Administered 2018-06-16: 65 mL via INTRAMUSCULAR

## 2018-06-16 MED ORDER — FENTANYL CITRATE (PF) 100 MCG/2ML IJ SOLN: 25.0000 ug | INTRAMUSCULAR | Status: DC | PRN

## 2018-06-16 MED ORDER — KETOROLAC TROMETHAMINE 30 MG/ML IJ SOLN
INTRAMUSCULAR | Status: AC
  Filled 2018-06-16: qty 1

## 2018-06-16 MED ORDER — CELECOXIB 200 MG PO CAPS
200.0000 mg | ORAL_CAPSULE | ORAL | Status: AC
  Administered 2018-06-16: 200 mg via ORAL

## 2018-06-16 MED ORDER — ONDANSETRON HCL 4 MG/2ML IJ SOLN
INTRAMUSCULAR | Status: DC | PRN
  Administered 2018-06-16: 4 mg via INTRAVENOUS

## 2018-06-16 MED ORDER — FAMOTIDINE 20 MG PO TABS
ORAL_TABLET | ORAL | Status: AC
  Filled 2018-06-16: qty 1

## 2018-06-16 MED ORDER — METRONIDAZOLE 500 MG PO TABS: 500.0000 mg | ORAL_TABLET | Freq: Three times a day (TID) | ORAL | 0 refills | Status: AC

## 2018-06-16 MED ORDER — MIDAZOLAM HCL 2 MG/2ML IJ SOLN
INTRAMUSCULAR | Status: AC
  Filled 2018-06-16: qty 2

## 2018-06-16 MED ORDER — PROPOFOL 10 MG/ML IV BOLUS
INTRAVENOUS | Status: DC | PRN
  Administered 2018-06-16: 50 mg via INTRAVENOUS

## 2018-06-16 MED ORDER — ACETAMINOPHEN 10 MG/ML IV SOLN
INTRAVENOUS | Status: DC | PRN
  Administered 2018-06-16: 1000 mg via INTRAVENOUS

## 2018-06-16 MED ORDER — FENTANYL CITRATE (PF) 100 MCG/2ML IJ SOLN
INTRAMUSCULAR | Status: DC | PRN
  Administered 2018-06-16 (×2): 25 ug via INTRAVENOUS

## 2018-06-16 MED ORDER — DEXMEDETOMIDINE HCL 200 MCG/2ML IV SOLN
INTRAVENOUS | Status: DC | PRN
  Administered 2018-06-16: 8 ug via INTRAVENOUS
  Administered 2018-06-16 (×3): 4 ug via INTRAVENOUS

## 2018-06-16 MED ORDER — SODIUM BICARBONATE 4 % IV SOLN
INTRAVENOUS | Status: AC
  Filled 2018-06-16: qty 5

## 2018-06-16 MED ORDER — PROPOFOL 500 MG/50ML IV EMUL
INTRAVENOUS | Status: AC
  Filled 2018-06-16: qty 50

## 2018-06-16 MED ORDER — BUPIVACAINE HCL (PF) 0.5 % IJ SOLN
INTRAMUSCULAR | Status: AC
  Filled 2018-06-16: qty 30

## 2018-06-16 MED ORDER — LIDOCAINE HCL (PF) 1 % IJ SOLN
INTRAMUSCULAR | Status: AC
  Filled 2018-06-16: qty 30

## 2018-06-16 MED ORDER — PROPOFOL 10 MG/ML IV BOLUS
INTRAVENOUS | Status: AC
  Filled 2018-06-16: qty 20

## 2018-06-16 MED ORDER — BUPIVACAINE LIPOSOME 1.3 % IJ SUSP
INTRAMUSCULAR | Status: AC
  Filled 2018-06-16: qty 20

## 2018-06-16 MED ORDER — ROCURONIUM BROMIDE 50 MG/5ML IV SOLN
INTRAVENOUS | Status: AC
  Filled 2018-06-16: qty 1

## 2018-06-16 MED ORDER — KETOROLAC TROMETHAMINE 30 MG/ML IJ SOLN
INTRAMUSCULAR | Status: DC | PRN
  Administered 2018-06-16: 30 mg via INTRAVENOUS

## 2018-06-16 MED ORDER — ACETAMINOPHEN 10 MG/ML IV SOLN
INTRAVENOUS | Status: AC
  Filled 2018-06-16: qty 100

## 2018-06-16 MED ORDER — SUCCINYLCHOLINE CHLORIDE 20 MG/ML IJ SOLN
INTRAMUSCULAR | Status: AC
  Filled 2018-06-16: qty 1

## 2018-06-16 MED ORDER — BUPIVACAINE-EPINEPHRINE (PF) 0.5% -1:200000 IJ SOLN
INTRAMUSCULAR | Status: AC
  Filled 2018-06-16: qty 30

## 2018-06-16 MED ORDER — CELECOXIB 200 MG PO CAPS
ORAL_CAPSULE | ORAL | Status: AC
  Filled 2018-06-16: qty 1

## 2018-06-16 MED ORDER — PROPOFOL 500 MG/50ML IV EMUL
INTRAVENOUS | Status: DC | PRN
  Administered 2018-06-16: 100 ug/kg/min via INTRAVENOUS

## 2018-06-16 MED ORDER — LIDOCAINE HCL (PF) 2 % IJ SOLN
INTRAMUSCULAR | Status: AC
  Filled 2018-06-16: qty 10

## 2018-06-16 MED ORDER — FENTANYL CITRATE (PF) 250 MCG/5ML IJ SOLN
INTRAMUSCULAR | Status: AC
  Filled 2018-06-16: qty 5

## 2018-06-16 MED ORDER — LIDOCAINE HCL (CARDIAC) PF 100 MG/5ML IV SOSY
PREFILLED_SYRINGE | INTRAVENOUS | Status: DC | PRN
  Administered 2018-06-16: 80 mg via INTRAVENOUS

## 2018-06-16 MED ORDER — TRAMADOL HCL 50 MG PO TABS: 50.0000 mg | ORAL_TABLET | Freq: Four times a day (QID) | ORAL | 0 refills | Status: DC | PRN

## 2018-06-16 MED ORDER — ONDANSETRON HCL 4 MG/2ML IJ SOLN: 4.0000 mg | Freq: Once | INTRAMUSCULAR | Status: DC | PRN

## 2018-06-16 MED ORDER — MIDAZOLAM HCL 2 MG/2ML IJ SOLN
INTRAMUSCULAR | Status: DC | PRN
  Administered 2018-06-16: 2 mg via INTRAVENOUS

## 2018-06-16 MED ORDER — FAMOTIDINE 20 MG PO TABS
20.0000 mg | ORAL_TABLET | Freq: Once | ORAL | Status: AC
  Administered 2018-06-16: 20 mg via ORAL

## 2018-06-16 MED ORDER — GABAPENTIN 300 MG PO CAPS
300.0000 mg | ORAL_CAPSULE | ORAL | Status: AC
  Administered 2018-06-16: 300 mg via ORAL

## 2018-06-16 MED ORDER — DEXAMETHASONE SODIUM PHOSPHATE 10 MG/ML IJ SOLN
INTRAMUSCULAR | Status: AC
  Filled 2018-06-16: qty 1

## 2018-06-16 MED ORDER — ONDANSETRON HCL 4 MG/2ML IJ SOLN
INTRAMUSCULAR | Status: AC
  Filled 2018-06-16: qty 2

## 2018-06-16 SURGICAL SUPPLY — 40 items
"PENCIL ELECTRO HAND CTR " (MISCELLANEOUS) IMPLANT
BLADE CLIPPER SURG (BLADE) ×2 IMPLANT
BLADE SURG 11 STRL SS SAFETY (MISCELLANEOUS) ×3 IMPLANT
BLADE SURG 15 STRL SS SAFETY (BLADE) ×5 IMPLANT
CANISTER SUCT 1200ML W/VALVE (MISCELLANEOUS) ×3 IMPLANT
CLOSURE WOUND 1/2 X4 (GAUZE/BANDAGES/DRESSINGS) ×1
COVER WAND RF STERILE (DRAPES) ×1 IMPLANT
DRAPE LAPAROTOMY 100X77 ABD (DRAPES) ×3 IMPLANT
DRSG TEGADERM 4X4.75 (GAUZE/BANDAGES/DRESSINGS) ×3 IMPLANT
DRSG TELFA 3X8 NADH (GAUZE/BANDAGES/DRESSINGS) ×3 IMPLANT
ELECT CAUTERY BLADE TIP 2.5 (TIP) ×3
ELECT REM PT RETURN 9FT ADLT (ELECTROSURGICAL) ×3
ELECTRODE CAUTERY BLDE TIP 2.5 (TIP) ×1 IMPLANT
ELECTRODE REM PT RTRN 9FT ADLT (ELECTROSURGICAL) ×1 IMPLANT
GAUZE 4X4 16PLY RFD (DISPOSABLE) ×2 IMPLANT
GLOVE BIO SURGEON STRL SZ7.5 (GLOVE) ×5 IMPLANT
GLOVE INDICATOR 8.0 STRL GRN (GLOVE) ×5 IMPLANT
GOWN STRL REUS W/ TWL LRG LVL3 (GOWN DISPOSABLE) ×2 IMPLANT
GOWN STRL REUS W/TWL LRG LVL3 (GOWN DISPOSABLE) ×4
KIT TURNOVER KIT A (KITS) ×3 IMPLANT
LABEL OR SOLS (LABEL) ×3 IMPLANT
NDL HYPO 25X1 1.5 SAFETY (NEEDLE) ×1 IMPLANT
NEEDLE HYPO 22GX1.5 SAFETY (NEEDLE) ×3 IMPLANT
NEEDLE HYPO 25X1 1.5 SAFETY (NEEDLE) ×3 IMPLANT
NS IRRIG 500ML POUR BTL (IV SOLUTION) ×3 IMPLANT
PACK BASIN MINOR ARMC (MISCELLANEOUS) ×3 IMPLANT
PAD DRESSING TELFA 3X8 NADH (GAUZE/BANDAGES/DRESSINGS) ×1 IMPLANT
PENCIL ELECTRO HAND CTR (MISCELLANEOUS) ×3 IMPLANT
SOL PREP PVP 2OZ (MISCELLANEOUS) ×3
SOLUTION PREP PVP 2OZ (MISCELLANEOUS) ×1 IMPLANT
STRIP CLOSURE SKIN 1/2X4 (GAUZE/BANDAGES/DRESSINGS) ×2 IMPLANT
SUT PROLENE 4-0 (SUTURE) ×2
SUT PROLENE 4-0 RB1 30XMFL BLU (SUTURE) ×1
SUT VIC AB 2-0 CT1 (SUTURE) ×3 IMPLANT
SUT VIC AB 2-0 CT2 27 (SUTURE) ×3 IMPLANT
SUT VICRYL 3-0 27IN (SUTURE) ×3 IMPLANT
SUT VICRYL+ 3-0 144IN (SUTURE) ×3 IMPLANT
SUTURE PROLEN 4-0 RB1 30XMFL (SUTURE) ×1 IMPLANT
SWABSTK COMLB BENZOIN TINCTURE (MISCELLANEOUS) ×5 IMPLANT
SYR 10ML LL (SYRINGE) ×5 IMPLANT

## 2018-06-16 NOTE — Transfer of Care (Signed)
Immediate Anesthesia Transfer of Care Note  Patient: Shawn Calhoun  Procedure(s) Performed: CYST EXCISION PILONIDAL EXTENSIVE (N/A )  Patient Location: PACU  Anesthesia Type:General  Level of Consciousness: drowsy  Airway & Oxygen Therapy: Patient Spontanous Breathing and Patient connected to face mask oxygen  Post-op Assessment: Report given to RN and Post -op Vital signs reviewed and stable  Post vital signs: Reviewed and stable  Last Vitals:  Vitals Value Taken Time  BP 98/61 06/16/2018  8:28 AM  Temp    Pulse 72 06/16/2018  8:30 AM  Resp 17 06/16/2018  8:30 AM  SpO2 100 % 06/16/2018  8:30 AM  Vitals shown include unvalidated device data.  Last Pain:  Vitals:   06/16/18 0622  PainSc: 0-No pain         Complications: No apparent anesthesia complications

## 2018-06-16 NOTE — Op Note (Signed)
Preoperative diagnosis: Pilonidal cyst.  Postoperative diagnosis: Same.  Operative procedure: Pilonidal cystectomy.  Operating Surgeon: Jasmine December, MD.  Anesthesia: Attended local, 65 cc 0.5% Xylocaine with 0.25% Marcaine with 1-200,000 notes of epinephrine.  Estimated blood loss: 10 cc.  Clinical note: This 28 year old male has had intermittent episodes of pain and swelling in the natal cleft.  Examination showed one dominant central pit.  He was made for elective excision.  Operative note: The patient received Invanz prior to the procedure.  He was rolled to the prone position and comfortably supported on pillows.  The buttocks were taped apart and the area cleansed with Betadine solution and draped.  Field block anesthesia was established.  There was noted to be 2 dominant pits as well as a small ridge of chronically inflamed tissue just below this about 2 cm above the level of the anal verge.  The area was outlined to a small narrow elliptical incision approximately 3 cm in length.  This was excised and there was a pocket of hair and chronic inflammatory tissue cephalad to this.  The incision was extended about 5 mm in this area was debrided sharply and with a curette and a sponge.  After all the chronic inflammatory tissue was removed the adipose tissue was elevated off the gluteal fascia with cautery.  Hemostasis was achieved.  The deep tissue was approximated with interrupted 2-0 Vicryl figure-of-eight sutures after releasing the distracting tape.  The soft tissue was approximated with a similar fashion and the skin closed with a running 3-0 Vicryl subcuticular suture.  Telfa and Tegaderm dressing applied.  The patient tolerated the procedure well and was taken recovery in stable condition.

## 2018-06-16 NOTE — Discharge Instructions (Signed)
AMBULATORY SURGERY  DISCHARGE INSTRUCTIONS   1) The drugs that you were given will stay in your system until tomorrow so for the next 24 hours you should not:  A) Drive an automobile B) Make any legal decisions C) Drink any alcoholic beverage   2) You may resume regular meals tomorrow.  Today it is better to start with liquids and gradually work up to solid foods.  You may eat anything you prefer, but it is better to start with liquids, then soup and crackers, and gradually work up to solid foods.   3) Please notify your doctor immediately if you have any unusual bleeding, trouble breathing, redness and pain at the surgery site, drainage, fever, or pain not relieved by medication.    4) Additional Instructions:  Flagyl (metronidazole): Take three times a day to minimize chance for infection.  Do not drink alcohol with this medication.        Please contact your physician with any problems or Same Day Surgery at [phone], Monday through Friday 6 am to 4 pm, or [place] at [facility] Main number at [phone].

## 2018-06-16 NOTE — H&P (Signed)
No interval change in clinical history or exam.  For pilonidal cyst excision.

## 2018-06-16 NOTE — Anesthesia Postprocedure Evaluation (Signed)
Anesthesia Post Note  Patient: Shawn Calhoun  Procedure(s) Performed: CYST EXCISION PILONIDAL EXTENSIVE (N/A )  Patient location during evaluation: PACU Anesthesia Type: General Level of consciousness: awake and alert Pain management: pain level controlled Vital Signs Assessment: post-procedure vital signs reviewed and stable Respiratory status: spontaneous breathing, nonlabored ventilation, respiratory function stable and patient connected to nasal cannula oxygen Cardiovascular status: blood pressure returned to baseline and stable Postop Assessment: no apparent nausea or vomiting Anesthetic complications: no     Last Vitals:  Vitals:   06/16/18 0906 06/16/18 0929  BP: 115/68 100/63  Pulse: 67 72  Resp: 16 16  Temp: (!) 36.2 C   SpO2: 100% 100%    Last Pain:  Vitals:   06/16/18 0906  TempSrc: Temporal  PainSc: 0-No pain                 Lakeyn Dokken S

## 2018-06-16 NOTE — Anesthesia Preprocedure Evaluation (Addendum)
Anesthesia Evaluation  Patient identified by MRN, date of birth, ID band Patient awake    Reviewed: Allergy & Precautions, NPO status , Patient's Chart, lab work & pertinent test results, reviewed documented beta blocker date and time   Airway Mallampati: III  TM Distance: >3 FB     Dental  (+) Chipped   Pulmonary former smoker,           Cardiovascular      Neuro/Psych    GI/Hepatic   Endo/Other    Renal/GU      Musculoskeletal   Abdominal   Peds  Hematology   Anesthesia Other Findings   Reproductive/Obstetrics                             Anesthesia Physical Anesthesia Plan  ASA: II  Anesthesia Plan: MAC   Post-op Pain Management:    Induction: Intravenous  PONV Risk Score and Plan:   Airway Management Planned:   Additional Equipment:   Intra-op Plan:   Post-operative Plan:   Informed Consent: I have reviewed the patients History and Physical, chart, labs and discussed the procedure including the risks, benefits and alternatives for the proposed anesthesia with the patient or authorized representative who has indicated his/her understanding and acceptance.       Plan Discussed with: CRNA  Anesthesia Plan Comments:        Anesthesia Quick Evaluation

## 2018-06-16 NOTE — Hospital Discharge Follow-Up (Signed)
Patient's wife called after discharge to get clarification regarding Flagyl dosing schedule and the Advil dosing schedule. This nurse verified from the chart the Flagyl is to be taken three times a day and the Advil can be taken every 6 hours. The wife verbalized understanding.

## 2018-06-16 NOTE — Anesthesia Post-op Follow-up Note (Signed)
Anesthesia QCDR form completed.        

## 2018-06-17 ENCOUNTER — Encounter: Payer: Self-pay | Admitting: General Surgery

## 2018-06-17 LAB — SURGICAL PATHOLOGY

## 2018-06-26 ENCOUNTER — Other Ambulatory Visit: Payer: Self-pay

## 2018-06-26 ENCOUNTER — Ambulatory Visit (INDEPENDENT_AMBULATORY_CARE_PROVIDER_SITE_OTHER): Payer: PRIVATE HEALTH INSURANCE | Admitting: General Surgery

## 2018-06-26 ENCOUNTER — Encounter: Payer: Self-pay | Admitting: General Surgery

## 2018-06-26 VITALS — BP 133/91 | HR 90 | Temp 97.5°F | Resp 14 | Ht 67.0 in | Wt 166.0 lb

## 2018-06-26 DIAGNOSIS — L0591 Pilonidal cyst without abscess: Secondary | ICD-10-CM

## 2018-06-26 NOTE — Patient Instructions (Signed)
Please call the office with any questions or concerns.

## 2018-06-26 NOTE — Progress Notes (Signed)
Patient ID: Shawn Calhoun, male   DOB: [birth year], 28 y.o.   MRN: [id number]  Chief Complaint  Patient presents with  . Routine Post Op    06/16/2018 Pilonidal cyst excision    HPI Shawn Calhoun is a 28 y.o. male.  Pilonidal cyst excision 06/16/2018. Complains of some discomfort, otherwise he is doing ok. No drainage.  HPI  Past Medical History:  Diagnosis Date  . GERD (gastroesophageal reflux disease)    occ no meds    Past Surgical History:  Procedure Laterality Date  . NO PAST SURGERIES    . PILONIDAL CYST EXCISION N/A 06/16/2018   Procedure: CYST EXCISION PILONIDAL EXTENSIVE;  Surgeon: Earline Mayotte, MD;  Location: [facility] ORS;  Service: General;  Laterality: N/A;    Family History  Problem Relation Age of Onset  . Healthy Mother   . Alzheimer's disease Father   . Hyperlipidemia Father     Social History Social History   Tobacco Use  . Smoking status: Former Smoker    Types: Cigars  . Smokeless tobacco: Never Used  . Tobacco comment: STATES HE HAS ONLY SMOKED A TOTAL OF 10 CIGARS IN HIS LIFE  Substance Use Topics  . Alcohol use: Not Currently  . Drug use: No    Allergies  Allergen Reactions  . Augmentin [Amoxicillin-Pot Clavulanate] Other (See Comments)    unknown  . Penicillins Hives    Did it involve swelling of the face/tongue/throat, SOB, or low BP? No Did it involve sudden or severe rash/hives, skin peeling, or any reaction on the inside of your mouth or nose? No Did you need to seek medical attention at a hospital or doctor's office? No When did it last happen?Childhood allergy If all above answers are "NO", may proceed with cephalosporin use.   . Sulfa Antibiotics Hives  . Tramadol Nausea And Vomiting  . Vicodin [Hydrocodone-Acetaminophen] Nausea And Vomiting    Current Outpatient Medications  Medication Sig Dispense Refill  . acetaminophen (TYLENOL) 500 MG tablet Take 1,000 mg by mouth every 6 (six) hours as needed for  moderate pain.    Marland Kitchen ibuprofen (ADVIL,MOTRIN) 200 MG tablet Take 400 mg by mouth every 6 (six) hours as needed for moderate pain.      No current facility-administered medications for this visit.     Review of Systems Review of Systems  Constitutional: Negative.   Respiratory: Negative.   Cardiovascular: Negative.     Blood pressure (!) 133/91, pulse 90, temperature (!) 97.5 F (36.4 C), temperature source Temporal, resp. rate 14, height 5\' 7"  (1.702 m), weight 166 lb (75.3 kg), SpO2 98 %.  Physical Exam Physical Exam Musculoskeletal:       Back:  Neurological:     Mental Status: He is alert.     Data Reviewed Pathology from the April 16, 2019 excision: DIAGNOSIS:  A. PILONIDAL CYST; EXCISION:  - CONSISTENT WITH PILONIDAL CYST, RUPTURED.   Assessment Doing well post pilonidal cyst excision.   Plan  The patient will report if he develops any pain or swelling.  Follow-up otherwise will be on an as-needed basis.   HPI, Physical Exam, Assessment and Plan have been scribed under the direction and in the presence of Earline Mayotte, MD. Arn Medal, CMA  I have completed the exam and reviewed the above documentation for accuracy and completeness.  I agree with the above.  Museum/gallery conservator has been used and any errors in dictation or transcription are unintentional.  Donnalee Curry, M.D.,  F.A.C.S.  Merrily [facility] Heward 06/26/2018, 8:35 PM

## 2018-08-08 ENCOUNTER — Emergency Department: Payer: No Typology Code available for payment source

## 2018-08-08 ENCOUNTER — Encounter: Payer: Self-pay | Admitting: Emergency Medicine

## 2018-08-08 ENCOUNTER — Other Ambulatory Visit: Payer: Self-pay

## 2018-08-08 ENCOUNTER — Emergency Department
Admission: EM | Admit: 2018-08-08 | Discharge: 2018-08-08 | Disposition: A | Discharge status: Home / Self Care | Payer: No Typology Code available for payment source | Attending: Emergency Medicine | Admitting: Emergency Medicine

## 2018-08-08 DIAGNOSIS — Z79899 Other long term (current) drug therapy: Secondary | ICD-10-CM | POA: Diagnosis not present

## 2018-08-08 DIAGNOSIS — R000 Tachycardia, unspecified: Secondary | ICD-10-CM | POA: Diagnosis not present

## 2018-08-08 DIAGNOSIS — Z87891 Personal history of nicotine dependence: Secondary | ICD-10-CM | POA: Insufficient documentation

## 2018-08-08 LAB — COMPREHENSIVE METABOLIC PANEL
ALT: 21 U/L (ref 0–44)
AST: 19 U/L (ref 15–41)
Albumin: 5 g/dL (ref 3.5–5.0)
Alkaline Phosphatase: 44 U/L (ref 38–126)
Anion gap: 11 (ref 5–15)
BUN: 20 mg/dL (ref 6–20)
CO2: 25 mmol/L (ref 22–32)
Calcium: 9 mg/dL (ref 8.9–10.3)
Chloride: 103 mmol/L (ref 98–111)
Creatinine, Ser: 0.98 mg/dL (ref 0.61–1.24)
GFR calc Af Amer: >60 mL/min (ref >60)
GFR calc non Af Amer: >60 mL/min (ref >60)
Glucose, Bld: 100 mg/dL — ABNORMAL HIGH (ref 70–99)
Potassium: 4 mmol/L (ref 3.5–5.1)
Sodium: 139 mmol/L (ref 135–145)
Total Bilirubin: 1.1 mg/dL (ref 0.3–1.2)
Total Protein: 7.6 g/dL (ref 6.5–8.1)

## 2018-08-08 LAB — CBC
HCT: 44.4 % (ref 39.0–52.0)
Hemoglobin: 15.4 g/dL (ref 13.0–17.0)
MCH: 29.3 pg (ref 26.0–34.0)
MCHC: 34.7 g/dL (ref 30.0–36.0)
MCV: 84.6 fL (ref 80.0–100.0)
Platelets: 188 10*3/uL (ref 150–400)
RBC: 5.25 MIL/uL (ref 4.22–5.81)
RDW: 11.9 % (ref 11.5–15.5)
WBC: 7.4 10*3/uL (ref 4.0–10.5)
nRBC: 0 % (ref 0.0–0.2)

## 2018-08-08 LAB — TROPONIN I: Troponin I: <0.03 ng/mL (ref <0.03)

## 2018-08-08 MED ORDER — SODIUM CHLORIDE 0.9 % IV BOLUS
1000.0000 mL | Freq: Once | INTRAVENOUS | Status: AC
  Administered 2018-08-08: 1000 mL via INTRAVENOUS

## 2018-08-08 MED ORDER — LORAZEPAM 2 MG/ML IJ SOLN
0.5000 mg | Freq: Once | INTRAMUSCULAR | Status: AC
  Administered 2018-08-08: 0.5 mg via INTRAVENOUS
  Filled 2018-08-08: qty 1

## 2018-08-08 MED ORDER — IOHEXOL 350 MG/ML SOLN
75.0000 mL | Freq: Once | INTRAVENOUS | Status: AC | PRN
  Administered 2018-08-08: 15:00:00 75 mL via INTRAVENOUS

## 2018-08-08 NOTE — ED Notes (Signed)
ED Provider at bedside. 

## 2018-08-08 NOTE — ED Provider Notes (Signed)
[facility] Emergency Department Provider Note  Time seen: 1:14 PM  I have reviewed the triage vital signs and the nursing notes.   HISTORY  Chief Complaint Tachycardia   HPI Shawn Calhoun is a 28 y.o. male with a past medical history of gastric reflux, presents to the emergency department for tachycardia.  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  Patient states he checked his pulse with a pulse oximeter and it was reading around 150.  He had his boss check his pulse and he confirmed around 115 called EMS to bring the patient to the emergency department.  Patient denies any chest "pain" but does state he feels fluttering every now and again.  Patient states a history of similar events in the past, he states one time was due to a pilonidal cyst which has since been surgically drained.  Patient denies any fever, travel or known sick contacts.  Does state slight cough which he relates to allergies x3 months.   Past Medical History:  Diagnosis Date  . GERD (gastroesophageal reflux disease)    occ no meds    Patient Active Problem List   Diagnosis Date Noted  . Pilonidal cyst 06/11/2018    Past Surgical History:  Procedure Laterality Date  . NO PAST SURGERIES    . PILONIDAL CYST EXCISION N/A 06/16/2018   Procedure: CYST EXCISION PILONIDAL EXTENSIVE;  Surgeon: Earline Mayotte, MD;  Location: [facility] ORS;  Service: General;  Laterality: N/A;    Prior to Admission medications   Medication Sig Start Date End Date Taking? Authorizing Provider  acetaminophen (TYLENOL) 500 MG tablet Take 1,000 mg by mouth every 6 (six) hours as needed for moderate pain.    [provider]  ibuprofen (ADVIL,MOTRIN) 200 MG tablet Take 400 mg by mouth every 6 (six) hours as needed for moderate pain.     [provider]    Allergies  Allergen Reactions  . Augmentin [Amoxicillin-Pot  Clavulanate] Other (See Comments)    unknown  . Penicillins Hives    Did it involve swelling of the face/tongue/throat, SOB, or low BP? No Did it involve sudden or severe rash/hives, skin peeling, or any reaction on the inside of your mouth or nose? No Did you need to seek medical attention at a hospital or doctor's office? No When did it last happen?Childhood allergy If all above answers are "NO", may proceed with cephalosporin use.   . Sulfa Antibiotics Hives  . Tramadol Nausea And Vomiting  . Vicodin [Hydrocodone-Acetaminophen] Nausea And Vomiting    Family History  Problem Relation Age of Onset  . Healthy Mother   . Alzheimer's disease Father   . Hyperlipidemia Father     Social History Social History   Tobacco Use  . Smoking status: Former Smoker    Types: Cigars  . Smokeless tobacco: Never Used  . Tobacco comment: STATES HE HAS ONLY SMOKED A TOTAL OF 10 CIGARS IN HIS LIFE  Substance Use Topics  . Alcohol use: Not Currently  . Drug use: No    Review of Systems Constitutional: Negative for fever. ENT: Negative for recent illness/congestion Cardiovascular: Fluttering sensation in his chest with rapid heart rate today.  Denies any "chest pain." Respiratory: Negative for shortness of breath.  Occasional cough x2 to 3 months. Gastrointestinal: Negative for abdominal pain, vomiting Musculoskeletal: Negative for musculoskeletal complaints Skin: Negative for skin complaints  Neurological: Negative  for headache All other ROS negative  ____________________________________________   PHYSICAL EXAM:  VITAL SIGNS: ED Triage Vitals  Enc Vitals Group     BP 08/08/18 1300 (!) 160/98     Pulse Rate 08/08/18 1300 (!) 112     Resp 08/08/18 1300 (!) 22     Temp 08/08/18 1305 98.4 F (36.9 C)     Temp Source 08/08/18 1305 Oral     SpO2 08/08/18 1300 100 %     Weight 08/08/18 1256 175 lb (79.4 kg)     Height 08/08/18 1256 5\' 7"  (1.702 m)     Head Circumference --       Peak Flow --      Pain Score 08/08/18 1256 0     Pain Loc --      Pain Edu? --      Excl. in GC? --    Constitutional: Alert and oriented. Well appearing and in no distress. Eyes: Normal exam ENT   Head: Normocephalic and atraumatic.   Mouth/Throat: Mucous membranes are moist. Cardiovascular: Regular rhythm rate around 120 bpm.  No murmur. Respiratory: Normal respiratory effort without tachypnea nor retractions. Breath sounds are clear and equal bilaterally. No wheezes/rales/rhonchi. Gastrointestinal: Soft and nontender. No distention.  Musculoskeletal: Nontender with normal range of motion in all extremities.. Neurologic:  Normal speech and language. No gross focal neurologic deficits  Skin:  Skin is warm, dry and intact.  Psychiatric: Mood and affect are normal.  ____________________________________________    EKG  EKG viewed and interpreted by myself shows sinus tachycardia 117 bpm with a narrow QRS, normal axis, normal intervals, no concerning ST changes.    INITIAL IMPRESSION / ASSESSMENT AND PLAN / ED COURSE  Pertinent labs & imaging results that were available during my care of the patient were reviewed by me and considered in my medical decision making (see chart for details).  Patient presents to the emergency department for tachycardia and fluttering sensation in his chest.  Differential would include sinus tachycardia, anxiety, ACS, arrhythmia.  Overall patient appears well appears to be in a sinus rhythm on his EKG and telemetry monitor.  Patient's lab work is reassuring including negative troponin normal chemistry and normal CBC.  Patient does state he has been worried somewhat more than normal, history of anxiety in his family although he has not been formally diagnosed with anxiety.  We will treat with IV fluids, dose a very small dose of Ativan and continue to closely monitor in the emergency department.  Heart rate currently around 110 bpm.  Patient's  work-up is reassuring, labs are normal including a negative troponin.  Heart rate currently 100 to 110 bpm.  We will discharge home.  I discussed orally hydrating as well as cardiology follow-up to discuss further work-up such as a Holter monitor.  Patient states his resting heart rate is usually around 90-100 bpm. ____________________________________________   FINAL CLINICAL IMPRESSION(S) / ED DIAGNOSES  Tachycardia   Minna Antis, MD 08/08/18 [phone]

## 2018-08-08 NOTE — ED Notes (Signed)
Pt assisted to bedside commode. Pt able to ambulate independently and in NAD at this time. This RN will continue to monitor.

## 2018-08-08 NOTE — ED Notes (Signed)
Patient transported to CT 

## 2018-08-08 NOTE — Discharge Instructions (Addendum)
Please call the number provided for cardiology to arrange a follow-up appointment to discuss further work-up such as a Holter monitor.  Return to the emergency department for any chest pain trouble breathing, or any other symptom personally concerning to yourself.  Drink plenty of fluids over the next several days, and avoid stimulants such as caffeine.

## 2018-08-08 NOTE — ED Triage Notes (Signed)
Pt presents from home via [facility] with c/o "fluttering" feeling in chest. Pt has had this feeling before and ended up having absess in chest that required surgery. Blood sugar 124. Pt currently in NAD at this time. 18G in RAC placed by acems. Pt heart rate currently 116 on the monitor.

## 2019-01-28 ENCOUNTER — Other Ambulatory Visit: Payer: Self-pay

## 2019-01-28 DIAGNOSIS — Z20822 Contact with and (suspected) exposure to covid-19: Secondary | ICD-10-CM

## 2019-01-30 LAB — NOVEL CORONAVIRUS, NAA: SARS-CoV-2, NAA: NOT DETECTED

## 2019-03-30 ENCOUNTER — Other Ambulatory Visit: Payer: Self-pay

## 2019-03-30 DIAGNOSIS — Z20822 Contact with and (suspected) exposure to covid-19: Secondary | ICD-10-CM

## 2019-04-01 LAB — NOVEL CORONAVIRUS, NAA: SARS-CoV-2, NAA: NOT DETECTED

## 2020-10-11 IMAGING — CT CT ANGIOGRAPHY CHEST
2 of 6 series · 19 of 46 positions shown · IV contrast (APPLIED)
Comparison: None.

CLINICAL DATA: Pt presents from home via [facility] with c/o
"fluttering" feeling in chest. Pt has had this feeling before and
ended up having absess in chest that required surgery.

EXAM:
CT ANGIOGRAPHY CHEST WITH CONTRAST
TECHNIQUE: Multidetector CT imaging of the chest was performed using the
standard protocol during bolus administration of intravenous
contrast. Multiplanar CT image reconstructions and MIPs were
obtained to evaluate the vascular anatomy.
CONTRAST:  75mL OMNIPAQUE IOHEXOL 350 MG/ML SOLN

[Series 5: thins · axial · 0.65mm/px · z∈[-319,-66]mm · 16 of 279 slices shown]
[im 13/279  lung]
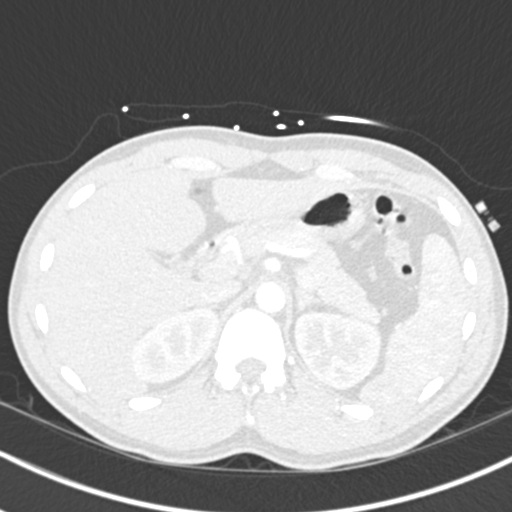
[im 37/279  soft-tissue]
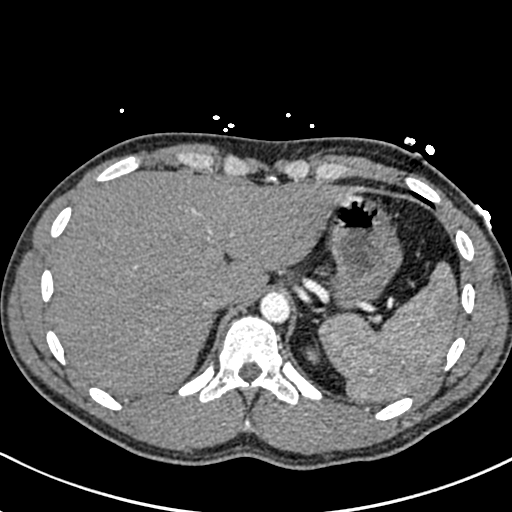
[im 49/279  lung]
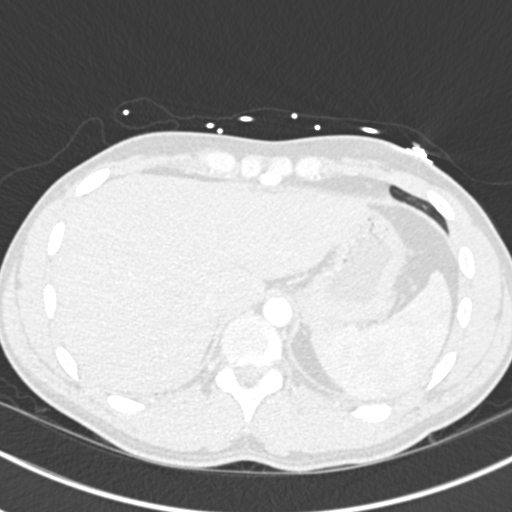
[im 61/279  soft-tissue]
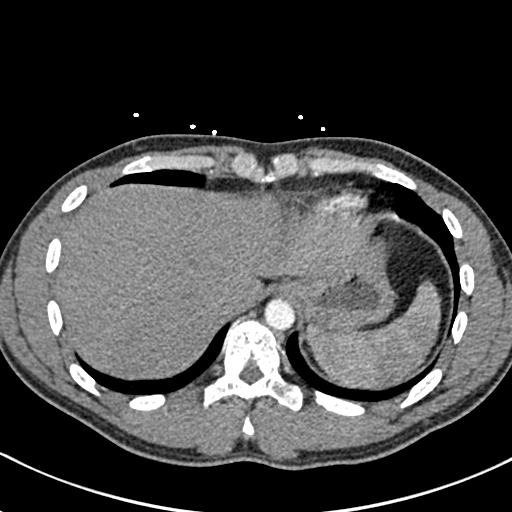
[im 85/279  lung]
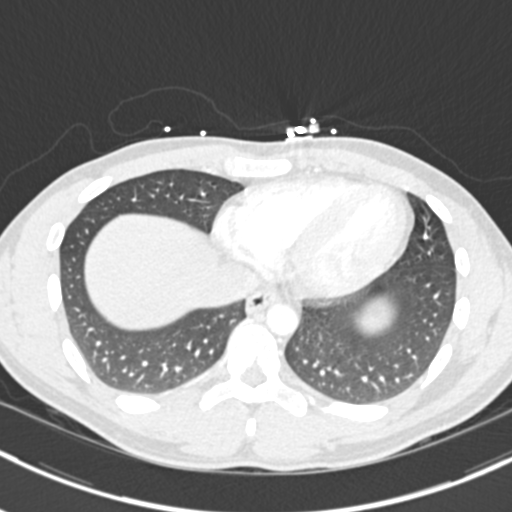
[im 97/279  soft-tissue]
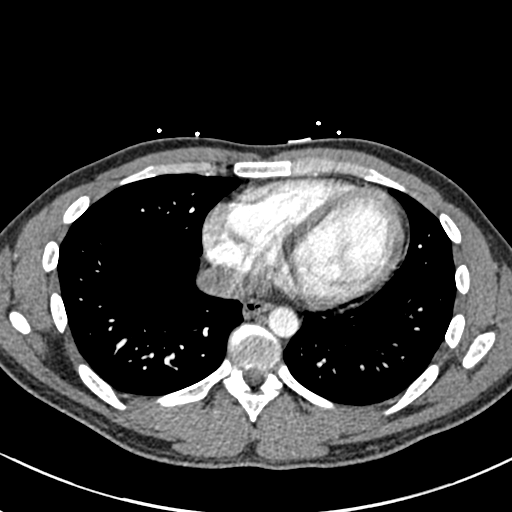
[im 109/279  lung]
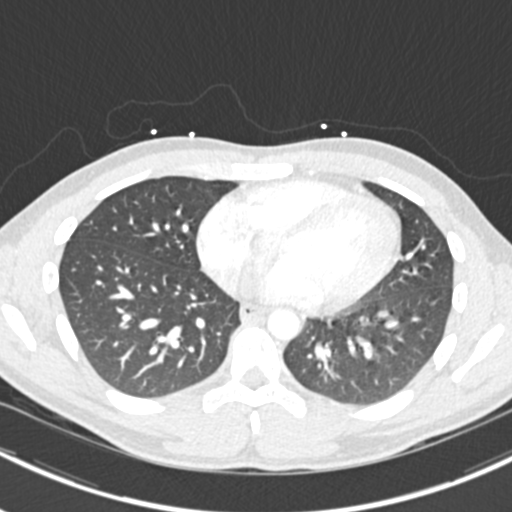
[im 133/279  soft-tissue]
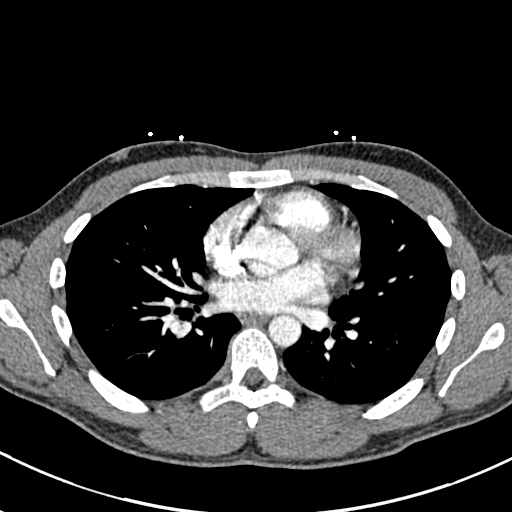
[im 146/279  lung]
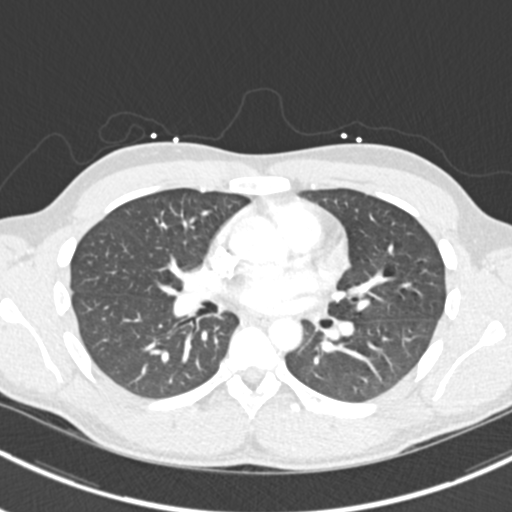
[im 170/279  soft-tissue]
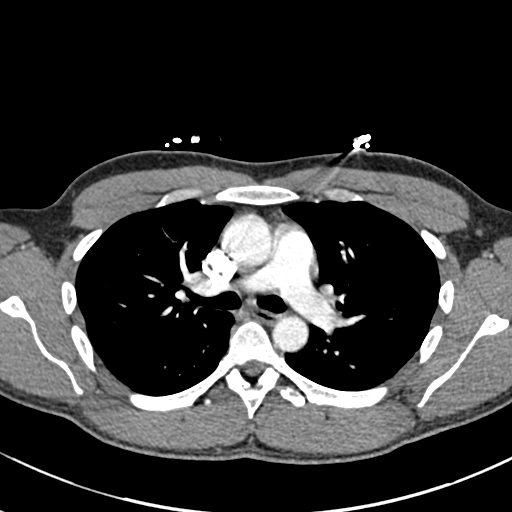
[im 182/279  lung]
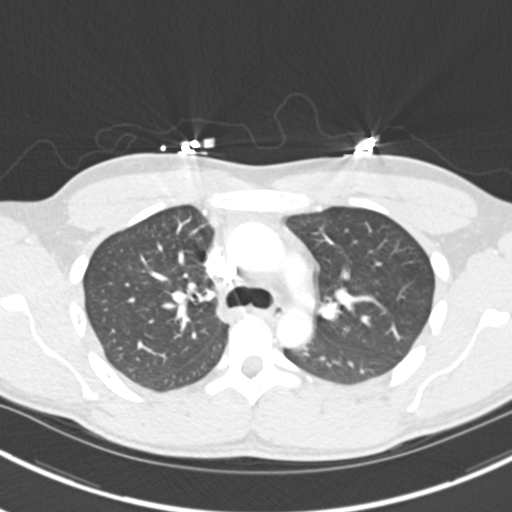
[im 194/279  soft-tissue]
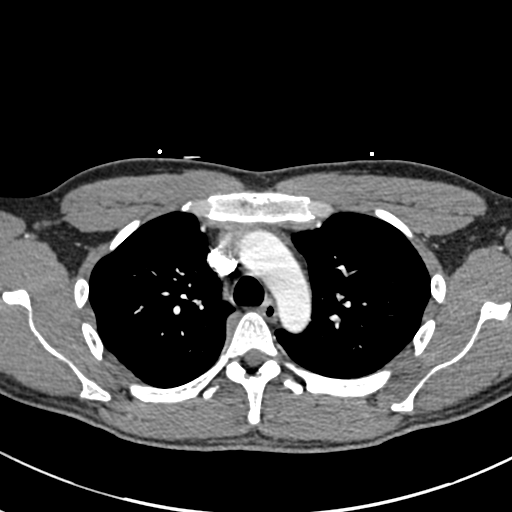
[im 218/279  lung]
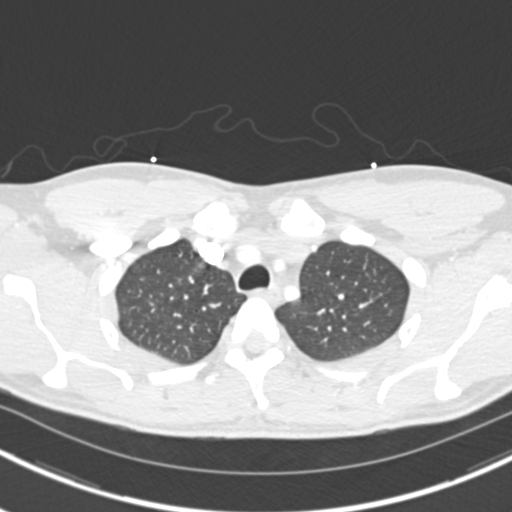
[im 230/279  soft-tissue]
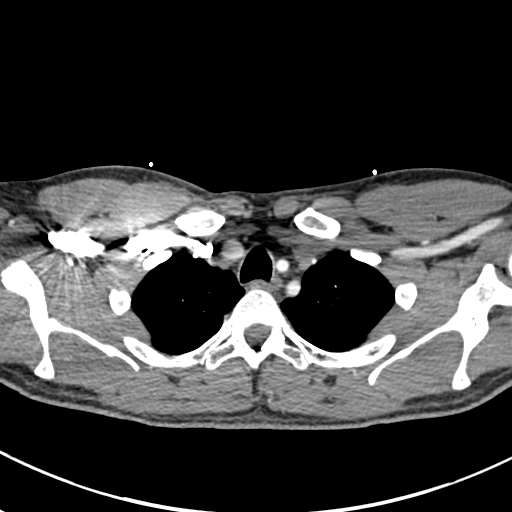
[im 242/279  lung]
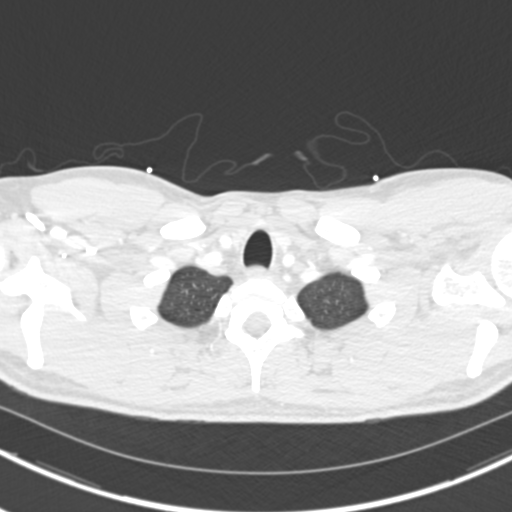
[im 266/279  soft-tissue]
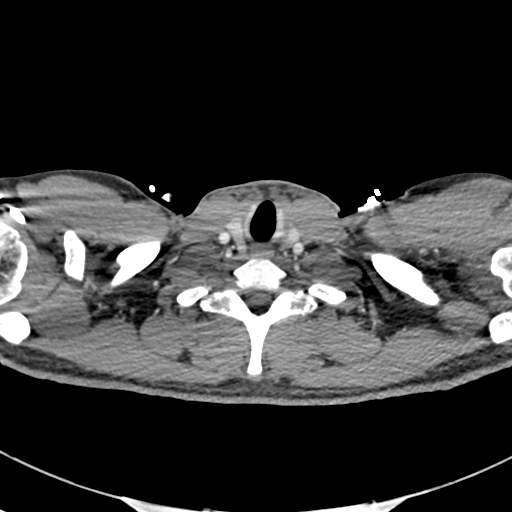

[Series 7: coronal mpr · coronal · 0.59mm/px · 3 of 75 slices shown]
[im 19/75  soft-tissue]
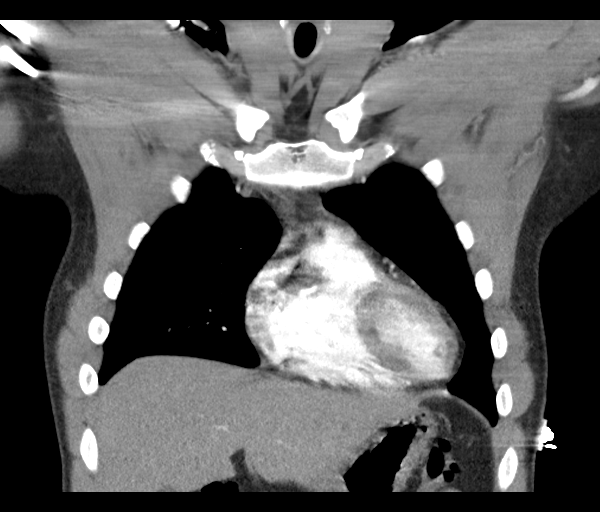
[im 38/75  soft-tissue]
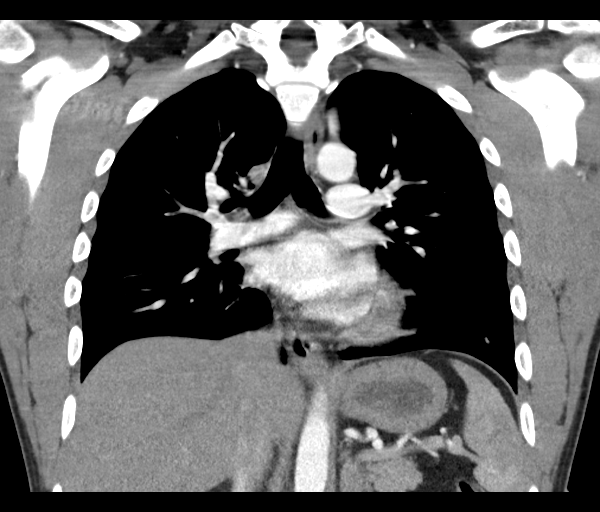
[im 56/75  soft-tissue]
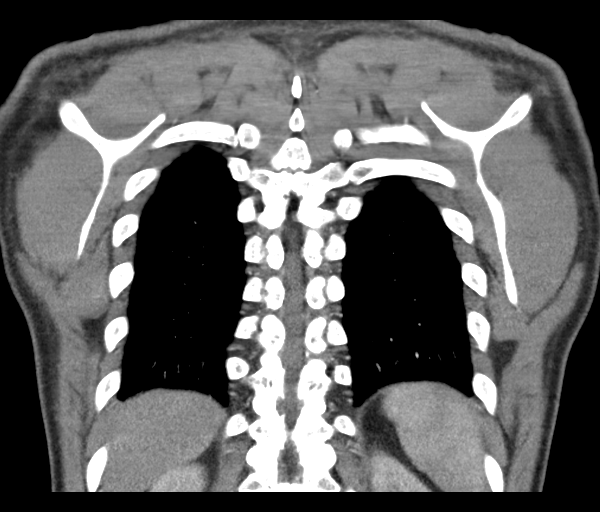

[19 of 46 positions shown; findings below may reference images not displayed]

FINDINGS: Cardiovascular: Satisfactory opacification of the pulmonary arteries
to the segmental level. No evidence of pulmonary embolism. Normal
heart size. No pericardial effusion. No coronary artery
calcifications. Great vessels are within normal limits.

Mediastinum/Nodes: No enlarged mediastinal, hilar, or axillary lymph
nodes. Thyroid gland, trachea, and esophagus demonstrate no
significant findings.

Lungs/Pleura: Lungs are clear. No pleural effusion or pneumothorax.

Upper Abdomen: Unremarkable.

Musculoskeletal: No chest wall abnormality. No acute or significant
osseous findings.

Review of the MIP images confirms the above findings.
IMPRESSION: 1. Normal exam.  No evidence of a pulmonary embolism.

## 2022-06-27 ENCOUNTER — Other Ambulatory Visit: Payer: Self-pay

## 2022-06-27 ENCOUNTER — Other Ambulatory Visit: Payer: Self-pay | Admitting: Internal Medicine

## 2022-06-27 MED ORDER — BUSPIRONE HCL 7.5 MG PO TABS: 7.5000 mg | ORAL_TABLET | Freq: Two times a day (BID) | ORAL | 0 refills | Status: DC

## 2022-07-23 ENCOUNTER — Other Ambulatory Visit: Payer: Self-pay

## 2022-07-26 ENCOUNTER — Other Ambulatory Visit: Payer: Self-pay | Admitting: Internal Medicine

## 2022-07-30 ENCOUNTER — Other Ambulatory Visit: Payer: Self-pay | Admitting: Internal Medicine

## 2022-07-30 MED ORDER — BUSPIRONE HCL 7.5 MG PO TABS: 7.5000 mg | ORAL_TABLET | Freq: Two times a day (BID) | ORAL | 0 refills | Status: DC

## 2022-09-25 ENCOUNTER — Other Ambulatory Visit: Payer: Self-pay | Admitting: Internal Medicine

## 2022-10-09 ENCOUNTER — Other Ambulatory Visit: Payer: Self-pay

## 2022-11-05 ENCOUNTER — Other Ambulatory Visit: Payer: Self-pay | Admitting: Internal Medicine

## 2022-11-24 ENCOUNTER — Other Ambulatory Visit: Payer: Self-pay | Admitting: Cardiovascular Disease

## 2022-12-28 ENCOUNTER — Encounter: Payer: Self-pay | Admitting: Cardiovascular Disease

## 2022-12-28 ENCOUNTER — Ambulatory Visit: Payer: No Typology Code available for payment source | Admitting: Cardiovascular Disease

## 2022-12-28 VITALS — BP 135/95 | HR 100 | Ht 67.0 in | Wt 170.8 lb

## 2022-12-28 DIAGNOSIS — I340 Nonrheumatic mitral (valve) insufficiency: Secondary | ICD-10-CM

## 2022-12-28 DIAGNOSIS — I4711 Inappropriate sinus tachycardia, so stated: Secondary | ICD-10-CM

## 2022-12-28 DIAGNOSIS — E782 Mixed hyperlipidemia: Secondary | ICD-10-CM | POA: Diagnosis not present

## 2022-12-28 DIAGNOSIS — R0789 Other chest pain: Secondary | ICD-10-CM | POA: Diagnosis not present

## 2022-12-28 MED ORDER — METOPROLOL SUCCINATE ER 50 MG PO TB24: 50.0000 mg | ORAL_TABLET | Freq: Every day | ORAL | 2 refills | Status: DC

## 2022-12-28 MED ORDER — ROSUVASTATIN CALCIUM 5 MG PO TABS: 5.0000 mg | ORAL_TABLET | Freq: Every day | ORAL | 3 refills | Status: DC

## 2022-12-28 NOTE — Progress Notes (Signed)
Cardiology Office Note   Date:  12/28/2022   ID:  Shawn Calhoun, [place] 1990-09-01, MRN [id number]  PCP:  Duanne Limerick, MD  Cardiologist:  Adrian Blackwater, MD      History of Present Illness: Shawn Calhoun is a 32 y.o. male who presents for  Chief Complaint  Patient presents with   Follow-up    1 yr    Just nervous      Past Medical History:  Diagnosis Date   GERD (gastroesophageal reflux disease)    occ no meds     Past Surgical History:  Procedure Laterality Date   NO PAST SURGERIES     PILONIDAL CYST EXCISION N/A 06/16/2018   Procedure: CYST EXCISION PILONIDAL EXTENSIVE;  Surgeon: Earline Mayotte, MD;  Location: [facility] ORS;  Service: General;  Laterality: N/A;     Current Outpatient Medications  Medication Sig Dispense Refill   acetaminophen (TYLENOL) 500 MG tablet Take 1,000 mg by mouth every 6 (six) hours as needed for moderate pain.     cetirizine (ZYRTEC) 10 MG tablet Take 10 mg by mouth daily.     fluticasone (FLONASE) 50 MCG/ACT nasal spray USE 1 SPRAY IN EACH NOSTRIL EVERY MORNING 24 mL 1   ibuprofen (ADVIL,MOTRIN) 200 MG tablet Take 400 mg by mouth every 6 (six) hours as needed for moderate pain.      metoprolol succinate (TOPROL-XL) 50 MG 24 hr tablet Take 1 tablet (50 mg total) by mouth daily. 90 tablet 2   rosuvastatin (CRESTOR) 5 MG tablet Take 1 tablet (5 mg total) by mouth daily. 90 tablet 3   No current facility-administered medications for this visit.    Allergies:   Augmentin [amoxicillin-pot clavulanate], Penicillins, Sulfa antibiotics, Tramadol, and Vicodin [hydrocodone-acetaminophen]    Social History:   reports that he has quit smoking. His smoking use included cigars. He has never used smokeless tobacco. He reports that he does not currently use alcohol. He reports that he does not use drugs.   Family History:  family history includes Alzheimer's disease in his father; Healthy in his mother; Hyperlipidemia in his father.     ROS:     Review of Systems  Constitutional: Negative.   HENT: Negative.    Eyes: Negative.   Respiratory: Negative.    Gastrointestinal: Negative.   Genitourinary: Negative.   Musculoskeletal: Negative.   Skin: Negative.   Neurological: Negative.   Endo/Heme/Allergies: Negative.   Psychiatric/Behavioral: Negative.    All other systems reviewed and are negative.     All other systems are reviewed and negative.    PHYSICAL EXAM: VS:  BP (!) 135/95   Pulse 100   Ht 5\' 7"  (1.702 m)   Wt 170 lb 12.8 oz (77.5 kg)   SpO2 98%   BMI 26.75 kg/m  , BMI Body mass index is 26.75 kg/m. Last weight:  Wt Readings from Last 3 Encounters:  12/28/22 170 lb 12.8 oz (77.5 kg)  08/08/18 175 lb (79.4 kg)  06/26/18 166 lb (75.3 kg)     Physical Exam Vitals reviewed.  Constitutional:      Appearance: Normal appearance. He is normal weight.  HENT:     Head: Normocephalic.     Nose: Nose normal.     Mouth/Throat:     Mouth: Mucous membranes are moist.  Eyes:     Pupils: Pupils are equal, round, and reactive to light.  Cardiovascular:     Rate and Rhythm: Normal rate and regular  rhythm.     Pulses: Normal pulses.     Heart sounds: Normal heart sounds.  Pulmonary:     Effort: Pulmonary effort is normal.  Abdominal:     General: Abdomen is flat. Bowel sounds are normal.  Musculoskeletal:        General: Normal range of motion.     Cervical back: Normal range of motion.  Skin:    General: Skin is warm.  Neurological:     General: No focal deficit present.     Mental Status: He is alert.  Psychiatric:        Mood and Affect: Mood normal.       EKG:   Recent Labs: No results found for requested labs within last 365 days.    Lipid Panel No results found for: "CHOL", "TRIG", "HDL", "CHOLHDL", "VLDL", "[facility]", "LDLDIRECT"    TESTS                                                                                          [facility] [address] [place], [place] [address] [phone] STUDY:  Gated Stress / Rest Myocardial Perfusion With Wall Motion, Left Ventricular Ejection Fraction.Treadmill Stress Test. SEX:       Male                     WEIGHT:  165 lbs                HEIGHT:  67 in                                                               ARMS UP: YES/NO                                                                        REFERRING PHYSICIAN:  Dr.Tiawana Forgy Welton Flakes  INDICATION FOR STUDY:  CP                                                                                                                                                                                                                   TECHNIQUE:  Approximately 20 minutes following the intravenous administration of 12.6 mCi of Tc-49m Sestamibi after stress testing in a reclined supine position with arms above their head if able to do so, gated SPECT imaging of the heart was performed. After about a 2hr break, the patient was injected intravenously with 33.0 mCi of Tc-18m Sestamibi.  Approximately 45 minutes later in the same position as stress imaging SPECT rest imaging of the heart was performed.  STRESS BY:  Adrian Blackwater, MD PROTOCOL:   Smitty Cords                                                                                       MAX PRED HR: 192                     85%: 163               75%: 144                                                                                                                   RESTING BP:  159/84    RESTING HR: 106    PEAK BP: 160/102      PEAK HR: 176 (91%)  EXERCISE DURATION:  10:01                                            METS:  11.7    REASON FOR TEST  TERMINATION: Target reached/1 min post injection                                                                                                                                  SYMPTOMS: None  [facility] TREADMILL SCORE:  10                                      RISK: Low                                                                                                                                                                                                            EKG RESULTS:  Sinus tachycardia. 106/min. No significant ST changes at peak exercise.                                                             IMAGE QUALITY: Excellent  PERFUSION/WALL MOTION FINDINGS:  EF = 70%. No perfusion defects, normal wall motion.                                                                          IMPRESSION: Normal stress test with normal LVEF.                                                                                                                                                                                                                                                                                       Adrian Blackwater, MD Stress Interpreting Physician / Nuclear Interpreting Physician                         Adrian Blackwater MD  Electronically signed by: Adrian Blackwater     Date: 08/25/2018 12:33 REASON FOR VISIT  Visit for: Echocardiogram/I47.1  Sex:   Male         wt=171    lbs.  BP=126/78  Height=67  inches.        TESTS  Imaging: Echocardiogram:  An echocardiogram in (2-d) mode was performed and in Doppler mode with color flow velocity mapping was performed. The aortic  valve cusps are abnormal 1.7   cm, flow velocity 1.3    m/s, and systolic calculated mean flow gradient 4  mmHg. Mitral valve diastolic peak flow velocity E .587     m/s and E/A ratio 1.0. Aortic root diameter 2.8  cm. The LVOT internal diameter 1.9   cm and flow velocity was abnormal 1.1   m/s. LV systolic dimension 2.8  cm, diastolic 4.5   cm, posterior wall thickness 0.9    cm, fractional shortening 30  %, and EF 65 %. IVS thickness 0.8  cm. LA dimension 3.0 cm  RIGHT atrium= 13.1   cm2. Tricuspid Valve has Trace Regurgitation.  ASSESSMENT  Technically adequate study.  Normal chamber sizes.  Normal left ventricular systolic function.  Normal right ventricular systolic function.  Normal right ventricular diastolic function.  Normal left ventricular wall motion.  Normal right ventricular wall motion.  Trace tricuspid regurgitation.  Normal pulmonary artery pressure.  No LVH.     THERAPY   Referring physician: Laurier Nancy  Sonographer: Stu F.      Adrian Blackwater MD  Electronically signed by: Adrian Blackwater     Date: 01/15/2022 11:41 Other studies Reviewed: Additional studies/ records that were reviewed today include:  Review of the above records demonstrates:       No data to display            ASSESSMENT AND PLAN:    ICD-10-CM   1. Mixed hyperlipidemia  E78.2 rosuvastatin (CRESTOR) 5 MG tablet    metoprolol succinate (TOPROL-XL) 50 MG 24 hr tablet   Not taking crestor and will fill it    2. Other chest pain  R07.89 metoprolol succinate (TOPROL-XL) 50 MG 24 hr tablet    3. Nonrheumatic mitral valve regurgitation  I34.0 metoprolol succinate (TOPROL-XL) 50 MG 24 hr tablet    4. Inappropriate sinus tachycardia  I47.11 metoprolol succinate (TOPROL-XL) 50 MG 24 hr tablet   overall doing well. Had in past heart rate 120       Problem List Items Addressed This Visit   None Visit Diagnoses     Mixed hyperlipidemia    -  Primary   Not taking crestor and  will fill it   Relevant Medications   rosuvastatin (CRESTOR) 5 MG tablet   metoprolol succinate (TOPROL-XL) 50 MG 24 hr tablet   Other chest pain       Relevant Medications   metoprolol succinate (TOPROL-XL) 50 MG 24 hr tablet   Nonrheumatic mitral valve regurgitation       Relevant Medications   rosuvastatin (CRESTOR) 5 MG tablet   metoprolol succinate (TOPROL-XL) 50 MG 24 hr tablet   Inappropriate sinus tachycardia       overall doing well. Had in past heart rate 120   Relevant Medications   rosuvastatin (CRESTOR) 5 MG tablet   metoprolol succinate (TOPROL-XL) 50 MG 24 hr tablet          Disposition:   No follow-ups on file.    Total time spent: 35 minutes  Signed,  Adrian Blackwater, MD  12/28/2022 9:20 AM    [facility]

## 2023-01-08 ENCOUNTER — Ambulatory Visit: Payer: No Typology Code available for payment source | Admitting: Internal Medicine

## 2023-04-19 ENCOUNTER — Other Ambulatory Visit: Payer: Self-pay

## 2023-05-13 MED ORDER — CETIRIZINE HCL 10 MG PO TABS: 10.0000 mg | ORAL_TABLET | Freq: Every day | ORAL | 3 refills | Status: DC

## 2023-12-24 ENCOUNTER — Other Ambulatory Visit: Payer: Self-pay | Admitting: Cardiovascular Disease

## 2023-12-24 DIAGNOSIS — I4711 Inappropriate sinus tachycardia, so stated: Secondary | ICD-10-CM

## 2023-12-24 DIAGNOSIS — R0789 Other chest pain: Secondary | ICD-10-CM

## 2023-12-24 DIAGNOSIS — I340 Nonrheumatic mitral (valve) insufficiency: Secondary | ICD-10-CM

## 2023-12-24 DIAGNOSIS — E782 Mixed hyperlipidemia: Secondary | ICD-10-CM

## 2023-12-25 ENCOUNTER — Other Ambulatory Visit: Payer: Self-pay | Admitting: Cardiovascular Disease

## 2023-12-25 DIAGNOSIS — E782 Mixed hyperlipidemia: Secondary | ICD-10-CM

## 2023-12-27 ENCOUNTER — Encounter: Payer: Self-pay | Admitting: Cardiovascular Disease

## 2023-12-27 ENCOUNTER — Ambulatory Visit: Payer: No Typology Code available for payment source | Admitting: Cardiovascular Disease

## 2023-12-27 VITALS — BP 130/82 | HR 90 | Ht 67.0 in | Wt 175.0 lb

## 2023-12-27 DIAGNOSIS — I4711 Inappropriate sinus tachycardia, so stated: Secondary | ICD-10-CM

## 2023-12-27 DIAGNOSIS — R0789 Other chest pain: Secondary | ICD-10-CM

## 2023-12-27 DIAGNOSIS — I340 Nonrheumatic mitral (valve) insufficiency: Secondary | ICD-10-CM

## 2023-12-27 DIAGNOSIS — E782 Mixed hyperlipidemia: Secondary | ICD-10-CM | POA: Diagnosis not present

## 2023-12-27 DIAGNOSIS — Z0130 Encounter for examination of blood pressure without abnormal findings: Secondary | ICD-10-CM

## 2023-12-27 NOTE — Progress Notes (Signed)
 Cardiology Office Note   Date:  12/27/2023   ID:  Shawn Calhoun, Peace Dec 20, 1990, MRN [id number]  PCP:  Joshua Cathryne BROCKS, MD (Inactive)  Cardiologist:  Denyse Bathe, MD      History of Present Illness: Shawn Calhoun is a 33 y.o. male who presents for  Chief Complaint  Patient presents with   Annual Exam    Annual wellness.    Doing well, no chetvpain or flutters.      Past Medical History:  Diagnosis Date   GERD (gastroesophageal reflux disease)    occ no meds     Past Surgical History:  Procedure Laterality Date   NO PAST SURGERIES     PILONIDAL CYST EXCISION N/A 06/16/2018   Procedure: CYST EXCISION PILONIDAL EXTENSIVE;  Surgeon: Dessa Reyes ORN, MD;  Location: [facility] ORS;  Service: General;  Laterality: N/A;     Current Outpatient Medications  Medication Sig Dispense Refill   acetaminophen  (TYLENOL ) 500 MG tablet Take 1,000 mg by mouth every 6 (six) hours as needed for moderate pain.     ibuprofen (ADVIL,MOTRIN) 200 MG tablet Take 400 mg by mouth every 6 (six) hours as needed for moderate pain.      metoprolol  succinate (TOPROL -XL) 50 MG 24 hr tablet TAKE 1 TABLET BY MOUTH EVERY DAY 90 tablet 2   rosuvastatin  (CRESTOR ) 5 MG tablet TAKE 1 TABLET (5 MG TOTAL) BY MOUTH DAILY. 90 tablet 3   No current facility-administered medications for this visit.    Allergies:   Augmentin [amoxicillin-pot clavulanate], Penicillins, Sulfa antibiotics, Tramadol , and Vicodin [hydrocodone-acetaminophen ]    Social History:   reports that he has quit smoking. His smoking use included cigars. He has never used smokeless tobacco. He reports that he does not currently use alcohol. He reports that he does not use drugs.   Family History:  family history includes Alzheimer's disease in his father; Healthy in his mother; Hyperlipidemia in his father.    ROS:     Review of Systems  Constitutional: Negative.   HENT: Negative.    Eyes: Negative.   Respiratory:  Negative.    Gastrointestinal: Negative.   Genitourinary: Negative.   Musculoskeletal: Negative.   Skin: Negative.   Neurological: Negative.   Endo/Heme/Allergies: Negative.   Psychiatric/Behavioral: Negative.    All other systems reviewed and are negative.     All other systems are reviewed and negative.    PHYSICAL EXAM: VS:  BP 130/82   Pulse 90   Ht 5' 7 (1.702 m)   Wt 175 lb (79.4 kg)   SpO2 98%   BMI 27.41 kg/m  , BMI Body mass index is 27.41 kg/m. Last weight:  Wt Readings from Last 3 Encounters:  12/27/23 175 lb (79.4 kg)  12/28/22 170 lb 12.8 oz (77.5 kg)  08/08/18 175 lb (79.4 kg)     Physical Exam Vitals reviewed.  Constitutional:      Appearance: Normal appearance. He is normal weight.  HENT:     Head: Normocephalic.     Nose: Nose normal.     Mouth/Throat:     Mouth: Mucous membranes are moist.  Eyes:     Pupils: Pupils are equal, round, and reactive to light.  Cardiovascular:     Rate and Rhythm: Normal rate and regular rhythm.     Pulses: Normal pulses.     Heart sounds: Normal heart sounds.  Pulmonary:     Effort: Pulmonary effort is normal.  Abdominal:  General: Abdomen is flat. Bowel sounds are normal.  Musculoskeletal:        General: Normal range of motion.     Cervical back: Normal range of motion.  Skin:    General: Skin is warm.  Neurological:     General: No focal deficit present.     Mental Status: He is alert.  Psychiatric:        Mood and Affect: Mood normal.       EKG:   Recent Labs: No results found for requested labs within last 365 days.    Lipid Panel No results found for: CHOL, TRIG, HDL, CHOLHDL, VLDL, LDLCALC, LDLDIRECT    Other studies Reviewed: Additional studies/ records that were reviewed today include:  Review of the above records demonstrates:       No data to display            ASSESSMENT AND PLAN:    ICD-10-CM   1. Mixed hyperlipidemia  E78.2     2. Other chest  pain  R07.89    No longer chest pain as stress test was normal 2 years ago.    3. Nonrheumatic mitral valve regurgitation  I34.0     4. Inappropriate sinus tachycardia (HCC)  I47.11    HR normal       Problem List Items Addressed This Visit   None Visit Diagnoses       Mixed hyperlipidemia    -  Primary     Other chest pain       No longer chest pain as stress test was normal 2 years ago.     Nonrheumatic mitral valve regurgitation         Inappropriate sinus tachycardia (HCC)       HR normal          Disposition:   Return in about 5 months (around 05/28/2024).    Total time spent: 35 minutes  Signed,  Denyse Bathe, MD  12/27/2023 9:31 AM    [facility]

## 2024-03-09 ENCOUNTER — Emergency Department
Admission: EM | Admit: 2024-03-09 | Discharge: 2024-03-09 | Disposition: A | Discharge status: Home / Self Care | Attending: Emergency Medicine | Admitting: Emergency Medicine

## 2024-03-09 ENCOUNTER — Other Ambulatory Visit: Payer: Self-pay

## 2024-03-09 DIAGNOSIS — W44F3XA Food entering into or through a natural orifice, initial encounter: Secondary | ICD-10-CM | POA: Insufficient documentation

## 2024-03-09 DIAGNOSIS — T18128A Food in esophagus causing other injury, initial encounter: Secondary | ICD-10-CM | POA: Insufficient documentation

## 2024-03-09 MED ORDER — GLUCAGON HCL RDNA (DIAGNOSTIC) 1 MG IJ SOLR
1.0000 mg | Freq: Once | INTRAMUSCULAR | Status: DC | PRN
  Filled 2024-03-09: qty 1

## 2024-03-09 MED ORDER — OMEPRAZOLE MAGNESIUM 20 MG PO TBEC: 20.0000 mg | DELAYED_RELEASE_TABLET | Freq: Every day | ORAL | 1 refills | Status: AC

## 2024-03-09 MED ORDER — GLUCAGON HCL RDNA (DIAGNOSTIC) 1 MG IJ SOLR
1.0000 mg | Freq: Once | INTRAMUSCULAR | Status: AC | PRN
  Administered 2024-03-09: 1 mg via INTRAVENOUS

## 2024-03-09 NOTE — ED Provider Notes (Signed)
 [facility] Provider Note    Event Date/Time   First MD Initiated Contact with Patient 03/09/24 1631     (approximate)   History   Choking   HPI  Shawn Calhoun is a 33 y.o. male  who presents to the emergency department today because of concern for choking. The patient had been eating pulled pork. Happened about thirty minutes prior to sensation.  He has tried swallowing since then but has not been able to pass anything.  He says that he has had similar episodes in the past although usually he is able to eventually pass the food.  He had been on antacids at 1 time but does not currently take any.     Physical Exam   Triage Vital Signs: ED Triage Vitals  Encounter Vitals Group     BP 03/09/24 1630 (!) 152/110     Girls Systolic BP Percentile --      Girls Diastolic BP Percentile --      Boys Systolic BP Percentile --      Boys Diastolic BP Percentile --      Pulse Rate 03/09/24 1630 (!) 103     Resp 03/09/24 1630 (!) 24     Temp 03/09/24 1630 97.9 F (36.6 C)     Temp src --      SpO2 03/09/24 1630 100 %     Weight 03/09/24 1630 176 lb 5.9 oz (80 kg)     Height 03/09/24 1630 5' 7 (1.702 m)     Head Circumference --      Peak Flow --      Pain Score 03/09/24 1629 7     Pain Loc --      Pain Education --      Exclude from Growth Chart --     Most recent vital signs: Vitals:   03/09/24 1630 03/09/24 1700  BP: (!) 152/110 130/88  Pulse: (!) 103 98  Resp: (!) 24   Temp: 97.9 F (36.6 C)   SpO2: 100% 100%   General: Awake, alert, anxious. CV:  Good peripheral perfusion. Tachycardia. Resp:  Normal effort. Lungs clear. Abd:  No distention.   ED Results / Procedures / Treatments   Labs (all labs ordered are listed, but only abnormal results are displayed) Labs Reviewed - No data to display   EKG  None   RADIOLOGY None   PROCEDURES:  Critical Care performed: No    MEDICATIONS ORDERED IN ED: Medications   glucagon (human recombinant) (GLUCAGEN) injection 1 mg (1 mg Intravenous Given 03/09/24 1637)     IMPRESSION / MDM / ASSESSMENT AND PLAN / ED COURSE  I reviewed the triage vital signs and the nursing notes.                              Differential diagnosis includes, but is not limited to, food impaction, gastritis, pancreatitis  Patient's presentation is most consistent with acute presentation with potential threat to life or bodily function.   Patient presented to the emergency department today because of concerns for a food impaction. Patient was given glucagon. He then attempted to swallow soda, which caused a large episode of emesis which he felt dislodged the impaction. He was then able to tolerate PO. Will plan on discharging to follow up with GI and will prescribe antiacids.    FINAL CLINICAL IMPRESSION(S) / ED DIAGNOSES   Final diagnoses:  Esophageal obstruction due to food impaction        Rx / DC Orders   ED Discharge Orders          Ordered    omeprazole (PRILOSEC OTC) 20 MG tablet  Daily        03/09/24 1728             Note:  This document was prepared using Dragon voice recognition software and may include unintentional dictation errors.    Floy Roberts, MD 03/09/24 [phone]

## 2024-03-09 NOTE — ED Triage Notes (Signed)
 Pt comes with c/o choking on pulled pork. Pt states he was eating and this all started. Pt stats he can't drink anything and keep it down. Pt states he can barely swallow. Pt can speak a little.

## 2024-03-09 NOTE — ED Notes (Signed)
 ED Provider at bedside.

## 2024-05-23 ENCOUNTER — Other Ambulatory Visit: Payer: Self-pay | Admitting: Cardiovascular Disease
